# Patient Record
Sex: Male | Born: 1950 | Race: White | Hispanic: No | Smoking: Current every day smoker
Health system: Southern US, Community
[De-identification: ages and names within clinical notes are randomized; demographics above are authoritative.]

## PROBLEM LIST (undated history)

## (undated) DIAGNOSIS — E119 Type 2 diabetes mellitus without complications: Secondary | ICD-10-CM

## (undated) DIAGNOSIS — E1140 Type 2 diabetes mellitus with diabetic neuropathy, unspecified: Secondary | ICD-10-CM

## (undated) DIAGNOSIS — E7800 Pure hypercholesterolemia, unspecified: Secondary | ICD-10-CM

## (undated) DIAGNOSIS — I10 Essential (primary) hypertension: Secondary | ICD-10-CM

## (undated) DIAGNOSIS — H409 Unspecified glaucoma: Secondary | ICD-10-CM

## (undated) DIAGNOSIS — E11319 Type 2 diabetes mellitus with unspecified diabetic retinopathy without macular edema: Secondary | ICD-10-CM

## (undated) HISTORY — DX: Type 2 diabetes mellitus with diabetic neuropathy, unspecified: E11.40

## (undated) HISTORY — DX: Pure hypercholesterolemia, unspecified: E78.00

## (undated) HISTORY — DX: Unspecified glaucoma: H40.9

## (undated) HISTORY — DX: Essential (primary) hypertension: I10

## (undated) HISTORY — DX: Type 2 diabetes mellitus with unspecified diabetic retinopathy without macular edema: E11.319

---

## 1994-06-12 HISTORY — PX: WRIST SURGERY: SHX841

## 2012-12-19 ENCOUNTER — Encounter (HOSPITAL_COMMUNITY): Payer: Self-pay | Admitting: *Deleted

## 2012-12-19 ENCOUNTER — Observation Stay (HOSPITAL_COMMUNITY): Payer: 59

## 2012-12-19 ENCOUNTER — Emergency Department (HOSPITAL_COMMUNITY): Payer: 59

## 2012-12-19 ENCOUNTER — Observation Stay (HOSPITAL_COMMUNITY)
Admission: EM | Admit: 2012-12-19 | Discharge: 2012-12-20 | Disposition: A | Discharge status: Home / Self Care | Payer: 59 | Attending: Family Medicine | Admitting: Family Medicine

## 2012-12-19 DIAGNOSIS — Z6834 Body mass index (BMI) 34.0-34.9, adult: Secondary | ICD-10-CM | POA: Insufficient documentation

## 2012-12-19 DIAGNOSIS — R0789 Other chest pain: Secondary | ICD-10-CM | POA: Diagnosis present

## 2012-12-19 DIAGNOSIS — J189 Pneumonia, unspecified organism: Principal | ICD-10-CM | POA: Insufficient documentation

## 2012-12-19 DIAGNOSIS — R739 Hyperglycemia, unspecified: Secondary | ICD-10-CM

## 2012-12-19 DIAGNOSIS — T17320A Food in larynx causing asphyxiation, initial encounter: Secondary | ICD-10-CM

## 2012-12-19 DIAGNOSIS — E669 Obesity, unspecified: Secondary | ICD-10-CM | POA: Insufficient documentation

## 2012-12-19 DIAGNOSIS — R0600 Dyspnea, unspecified: Secondary | ICD-10-CM

## 2012-12-19 DIAGNOSIS — R071 Chest pain on breathing: Secondary | ICD-10-CM | POA: Insufficient documentation

## 2012-12-19 DIAGNOSIS — R0602 Shortness of breath: Secondary | ICD-10-CM | POA: Insufficient documentation

## 2012-12-19 DIAGNOSIS — E119 Type 2 diabetes mellitus without complications: Secondary | ICD-10-CM

## 2012-12-19 DIAGNOSIS — R030 Elevated blood-pressure reading, without diagnosis of hypertension: Secondary | ICD-10-CM | POA: Insufficient documentation

## 2012-12-19 LAB — CBC WITH DIFFERENTIAL/PLATELET
Basophils Absolute: 0 10*3/uL (ref 0.0–0.1)
Basophils Relative: 0 % (ref 0–1)
Eosinophils Absolute: 0 10*3/uL (ref 0.0–0.7)
Eosinophils Relative: 0 % (ref 0–5)
MCH: 31.2 pg (ref 26.0–34.0)
MCHC: 35.3 g/dL (ref 30.0–36.0)
Neutrophils Relative %: 77 % (ref 43–77)
Platelets: 187 10*3/uL (ref 150–400)
RBC: 5.03 MIL/uL (ref 4.22–5.81)
RDW: 12.4 % (ref 11.5–15.5)

## 2012-12-19 LAB — BASIC METABOLIC PANEL
Calcium: 9.4 mg/dL (ref 8.4–10.5)
Creatinine, Ser: 0.67 mg/dL (ref 0.50–1.35)
GFR calc non Af Amer: >90 mL/min (ref >90)
Glucose, Bld: 417 mg/dL — ABNORMAL HIGH (ref 70–99)
Sodium: 132 mEq/L — ABNORMAL LOW (ref 135–145)

## 2012-12-19 LAB — D-DIMER, QUANTITATIVE: D-Dimer, Quant: <0.27 ug/mL-FEU (ref 0.00–0.48)

## 2012-12-19 LAB — TROPONIN I: Troponin I: <0.3 ng/mL (ref <0.30)

## 2012-12-19 LAB — GLUCOSE, CAPILLARY

## 2012-12-19 MED ORDER — ACETAMINOPHEN 325 MG PO TABS: 650.0000 mg | ORAL_TABLET | ORAL | Status: DC | PRN

## 2012-12-19 MED ORDER — CLINDAMYCIN PHOSPHATE 600 MG/50ML IV SOLN
600.0000 mg | Freq: Once | INTRAVENOUS | Status: AC
  Administered 2012-12-19: 600 mg via INTRAVENOUS
  Filled 2012-12-19: qty 50

## 2012-12-19 MED ORDER — RISAQUAD PO CAPS: 2.0000 | ORAL_CAPSULE | Freq: Every day | ORAL | Status: DC

## 2012-12-19 MED ORDER — MORPHINE SULFATE 4 MG/ML IJ SOLN
4.0000 mg | INTRAMUSCULAR | Status: AC | PRN
  Administered 2012-12-19 (×2): 4 mg via INTRAVENOUS
  Filled 2012-12-19 (×2): qty 1

## 2012-12-19 MED ORDER — INSULIN ASPART 100 UNIT/ML ~~LOC~~ SOLN
0.0000 [IU] | Freq: Three times a day (TID) | SUBCUTANEOUS | Status: DC
  Administered 2012-12-20: 5 [IU] via SUBCUTANEOUS

## 2012-12-19 MED ORDER — BISACODYL 10 MG RE SUPP: 10.0000 mg | Freq: Every day | RECTAL | Status: DC | PRN

## 2012-12-19 MED ORDER — CLINDAMYCIN PHOSPHATE 600 MG/50ML IV SOLN
600.0000 mg | Freq: Three times a day (TID) | INTRAVENOUS | Status: DC
  Filled 2012-12-19 (×2): qty 50

## 2012-12-19 MED ORDER — DEXTROSE 5 % IV SOLN: 1.0000 g | INTRAVENOUS | Status: DC

## 2012-12-19 MED ORDER — ALBUTEROL SULFATE (5 MG/ML) 0.5% IN NEBU: 2.5000 mg | INHALATION_SOLUTION | RESPIRATORY_TRACT | Status: DC | PRN

## 2012-12-19 MED ORDER — MORPHINE SULFATE 4 MG/ML IJ SOLN: 4.0000 mg | INTRAMUSCULAR | Status: DC | PRN

## 2012-12-19 MED ORDER — SODIUM CHLORIDE 0.9 % IV SOLN
INTRAVENOUS | Status: DC
  Administered 2012-12-19 (×2): via INTRAVENOUS

## 2012-12-19 MED ORDER — DEXTROSE 5 % IV SOLN: 500.0000 mg | INTRAVENOUS | Status: DC

## 2012-12-19 MED ORDER — FLEET ENEMA 7-19 GM/118ML RE ENEM: 1.0000 | ENEMA | Freq: Every day | RECTAL | Status: DC | PRN

## 2012-12-19 MED ORDER — POTASSIUM CHLORIDE IN NACL 20-0.9 MEQ/L-% IV SOLN
INTRAVENOUS | Status: DC
  Administered 2012-12-19 – 2012-12-20 (×2): via INTRAVENOUS

## 2012-12-19 MED ORDER — ENOXAPARIN SODIUM 40 MG/0.4ML ~~LOC~~ SOLN
40.0000 mg | SUBCUTANEOUS | Status: DC
  Administered 2012-12-19: 40 mg via SUBCUTANEOUS
  Filled 2012-12-19: qty 0.4

## 2012-12-19 MED ORDER — POLYETHYLENE GLYCOL 3350 17 G PO PACK
17.0000 g | PACK | Freq: Every day | ORAL | Status: DC
  Filled 2012-12-19: qty 1

## 2012-12-19 MED ORDER — IOHEXOL 300 MG/ML  SOLN
80.0000 mL | Freq: Once | INTRAMUSCULAR | Status: AC | PRN
  Administered 2012-12-19: 80 mL via INTRAVENOUS

## 2012-12-19 MED ORDER — DEXTROSE 5 % IV SOLN
1.0000 g | Freq: Once | INTRAVENOUS | Status: AC
  Administered 2012-12-19: 1 g via INTRAVENOUS
  Filled 2012-12-19: qty 10

## 2012-12-19 MED ORDER — ONDANSETRON HCL 4 MG/2ML IJ SOLN: 4.0000 mg | Freq: Three times a day (TID) | INTRAMUSCULAR | Status: DC | PRN

## 2012-12-19 MED ORDER — INSULIN ASPART 100 UNIT/ML ~~LOC~~ SOLN: 0.0000 [IU] | Freq: Every day | SUBCUTANEOUS | Status: DC

## 2012-12-19 MED ORDER — IBUPROFEN 600 MG PO TABS
600.0000 mg | ORAL_TABLET | Freq: Four times a day (QID) | ORAL | Status: DC | PRN
  Administered 2012-12-19 – 2012-12-20 (×2): 600 mg via ORAL
  Filled 2012-12-19 (×2): qty 1

## 2012-12-19 MED ORDER — ONDANSETRON HCL 4 MG/2ML IJ SOLN
4.0000 mg | INTRAMUSCULAR | Status: DC | PRN
  Administered 2012-12-19: 4 mg via INTRAVENOUS
  Filled 2012-12-19: qty 2

## 2012-12-19 MED ORDER — ONDANSETRON HCL 4 MG/2ML IJ SOLN: 4.0000 mg | INTRAMUSCULAR | Status: DC | PRN

## 2012-12-19 MED ORDER — AZITHROMYCIN 250 MG PO TABS
500.0000 mg | ORAL_TABLET | Freq: Once | ORAL | Status: AC
  Administered 2012-12-19: 500 mg via ORAL
  Filled 2012-12-19: qty 2

## 2012-12-19 MED ORDER — CLINDAMYCIN PHOSPHATE 600 MG/50ML IV SOLN
INTRAVENOUS | Status: AC
  Filled 2012-12-19: qty 50

## 2012-12-19 NOTE — ED Notes (Signed)
Pulse ox 90% on RA, pt placed on O2 at 2L/min.  O2 sats now 95%.

## 2012-12-19 NOTE — ED Notes (Signed)
C/o right sided CP and SOB

## 2012-12-19 NOTE — ED Notes (Signed)
Reports choked on hot dog x 3 days ago - c/o increased sob and cp since.  C/o right sided cp, worse with movement.  Denies n/v/dizziness.

## 2012-12-19 NOTE — H&P (Signed)
[facility] History and Physical  Jeffrey Sosa  EXB:[id number]  DOB: 18-May-1951   DOA: 12/19/2012   PCP:   No PCP Per Patient   Chief Complaint:  Chest pain with movement and breathing since today  HPI: Jeffrey Sosa is a 62 y.o. male.  Obese Caucasian gentleman who has had no medical followup for the past 25 year, and who is very active in the construction business, a reports that he developed a pain in her right anterior chest or excessive extending his right arm at work yesterday. Since then he is having pain with movement of the arm and pain with breathing of note is that it is much better after he rests especially after overnight rest.   He reports that the pain got suddenly worse today he became very worried his family say he got very panicky and short of breath and came to the emergency room to evaluated. In the emergency room chest x-ray was done which revealed an abnormality, his d-dimer was negative, and the hospitalist service was called for admission for possible pneumonia. He also gave a history of having choked on food 3 days ago and emergency room physician felt he may have aspirated.  He denies fever or cough. And otherwise doesn't feel sick. For the past couple of years she's been waking up at night pass urine but he feels this is pretty normal.  He regards himself as being in very good health, and is proud of the fact that he comes from a very healthy family; he considers his weight to be normal.   Rewiew of Systems:   All systems negative except as marked bold or noted in the HPI;  Constitutional:    malaise, fever and chills. ;  Eyes:   eye pain, redness and discharge. ;  ENMT:   ear pain, hoarseness, nasal congestion, sinus pressure and sore throat. ;  Cardiovascular:    chest pain with breathing, palpitations, diaphoresis, dyspnea and peripheral edema.  Respiratory:   cough, hemoptysis, wheezing and stridor. ;  Gastrointestinal:  nausea, vomiting, diarrhea,  constipation, abdominal pain, melena, blood in stool, hematemesis, jaundice and rectal bleeding. unusual weight loss..   Genitourinary:    frequency, dysuria, incontinence,flank pain and hematuria; once/twice/night Musculoskeletal:   back pain and neck pain.  swelling and trauma.;  Skin: .  pruritus, rash, abrasions, bruising and skin lesion.; ulcerations Neuro:    headache, lightheadedness and neck stiffness.  weakness, altered level of consciousness, altered mental status, extremity weakness, burning feet, involuntary movement, seizure and syncope.  Psych:    anxiety, depression, insomnia, tearfulness, panic attacks, hallucinations, paranoia, suicidal or homicidal ideation   History reviewed. No pertinent past medical history.  History reviewed. No pertinent past surgical history.  Medications:  HOME MEDS: Prior to Admission medications   Not on File     Allergies:  No Known Allergies  Social History:   reports that he has been smoking Cigars.  He does not have any smokeless tobacco history on file. He reports that  drinks alcohol. He reports that he does not use illicit drugs.  Family History: See relevant sections of epic  Physical Exam: Filed Vitals:   12/19/12 1900 12/19/12 1930 12/19/12 1933 12/19/12 2009  BP: 150/68   154/89  Pulse:    102  Temp:    98 F (36.7 C)  TempSrc:    Oral  Resp: 21 24 18 18   Weight:    99.383 kg (219 lb 1.6 oz)  SpO2:  94%   Blood pressure 154/89, pulse 102, temperature 98 F (36.7 C), temperature source Oral, resp. rate 18, weight 99.383 kg (219 lb 1.6 oz), SpO2 94.00%. There is no height on file to calculate BMI.   GEN:  Pleasant obese and somewhat anxious middle-aged Caucasian gentleman lying bed ; cooperative with exam; eating 2 large hamburgers and an apple pie during the interview; no vegetables in his meal. PSYCH:  alert and oriented x4;   affect is appropriate. HEENT: Mucous membranes pink and anicteric; PERRLA; EOM intact;  no cervical lymphadenopathy nor thyromegaly or carotid bruit; no JVD; Breasts:: Not examined CHEST WALL: Tender right pectoralis CHEST: Normal respiration, coarse crackles past right lower half of his chest posteriorly HEART: Tachycardic regular rhythm; no murmurs rubs or gallops BACK: No kyphosis no scoliosis; no CVA tenderness ABDOMEN: Obese, soft non-tender; no masses, no organomegaly, normal abdominal bowel sounds;  Rectal Exam: Not done EXTREMITIES: No bone or joint deformity;; no edema; no ulcerations. Genitalia: not examined PULSES: 2+ and symmetric SKIN: Normal hydration no rash or ulceration CNS: Cranial nerves 2-12 grossly intact no focal lateralizing neurologic deficit   Labs on Admission:  Basic Metabolic Panel:  Recent Labs Lab 12/19/12 1735  NA 132*  K 4.1  CL 96  CO2 26  GLUCOSE 417*  BUN 15  CREATININE 0.67  CALCIUM 9.4   Liver Function Tests: No results found for this basename: AST, ALT, ALKPHOS, BILITOT, PROT, ALBUMIN,  in the last 168 hours No results found for this basename: LIPASE, AMYLASE,  in the last 168 hours No results found for this basename: AMMONIA,  in the last 168 hours CBC:  Recent Labs Lab 12/19/12 1735  WBC 11.9*  NEUTROABS 9.2*  HGB 15.7  HCT 44.5  MCV 88.5  PLT 187   Cardiac Enzymes:  Recent Labs Lab 12/19/12 1735  TROPONINI <0.30   BNP: No components found with this basename: POCBNP,  D-dimer: No components found with this basename: D-DIMER,  CBG: No results found for this basename: GLUCAP,  in the last 168 hours  Radiological Exams on Admission: Dg Chest Portable 1 View  12/19/2012   *RADIOLOGY REPORT*  Clinical Data: Chest pain and shortness of breath.  PORTABLE CHEST - 1 VIEW  Comparison: None.  Findings: Low volume lungs with indistinct right hemidiaphragm. Negative for edema or pneumothorax.  Likely no cardiomegaly when accounting for technique. No acute osseous abnormality.  IMPRESSION: Low volume lungs which  decreases sensitivity.  A right base opacity could either represent atelectasis or infiltrate.   Original Report Authenticated By: Tiburcio Pea    EKG: Independently reviewed. Sinus tachycardia otherwise normal   Assessment/Plan   Active Problems:   Dyspnea, transient,likely secondary to chest wall pain and anxiety; reassure   Chest wall pain  Likely a muscle strain as a result of his occupation and lack of exercise; will treat with NSAIDs   CAP (community acquired pneumonia)  Surprisingly abnormal chest exam; unlikely to be true pneumonia given the lack of supporting evidence; we'll treat initially as pneumonia but will get a CT scan of his chest since he does have a significant tobacco history.   Diabetes mellitus, type 2, newly discovered  Briefly discussed implications; check hemoglobin A1c; start sliding scale; will need diabetic education and likely discharge home insulin and endocrinology followup.    Obesity, unspecified  He agreed to discuss his weight; Briefly discussed the concept of having 50% of his meal as fruits and vegetables; discussed start to an exercise program  5-10 minutes 3 times per week and increase as tolerated. Advised that he would need a consult with a dietitian because of his diabetes; advised that we would wait for his blood sugars may normalize. Check thyroid function and fasting lipid  Elevated blood pressure  Advise Will continue to monitor while in and he would be wise to continue to monitor at home    Other plans as per orders.  Code Status: Full code Family Communication:  Plans discuss with patient and family at bedside Disposition Plan: Likely home in a day or 2   Isidora Laham Nocturnist [facility] Pager [phone]   12/19/2012, 8:40 PM

## 2012-12-19 NOTE — ED Provider Notes (Signed)
History    CSN: [id number] Arrival date & time 12/19/12  1636  First MD Initiated Contact with Patient 12/19/12 1708     Chief Complaint  Patient presents with  . Shortness of Breath    HPI Pt was seen at 1605.   Per pt, c/o gradual onset and worsening of persistent SOB and right sided chest "pain" since this morning. Pt states he works in [employer] and was walking up a ladder when he "grabbed an upright that was loose" with his right hand, and "jerked my body."  States he has had progressive right sided chest wall "spasm" pain and SOB since then. These symptoms worsen with deep breath and movement of his right arm. Denies direct injury, no palpitations, no abd pain, no N/V/D, no fevers. Pt also recalls he "choked" on food 3 days ago but has been able to eat without difficulty since that time.  Denies LOC, no AMS, no wheezing, sore throat.     History reviewed. No pertinent past medical history.  History reviewed. No pertinent past surgical history.   History  Substance Use Topics  . Smoking status: Current Every Day Smoker    Types: Cigars  . Smokeless tobacco: Not on file  . Alcohol Use: Yes     Comment: daily    Review of Systems ROS: Statement: All systems negative except as marked or noted in the HPI; Constitutional: Negative for fever and chills. ; ; Eyes: Negative for eye pain, redness and discharge. ; ; ENMT: Negative for ear pain, hoarseness, nasal congestion, sinus pressure and sore throat. ; ; Cardiovascular: +CP, SOB. Negative for palpitations, diaphoresis, and peripheral edema. ; ; Respiratory: Negative for cough, wheezing and stridor. ; ; Gastrointestinal: Negative for nausea, vomiting, diarrhea, abdominal pain, blood in stool, hematemesis, jaundice and rectal bleeding. . ; ; Genitourinary: Negative for dysuria, flank pain and hematuria. ; ; Musculoskeletal: Negative for back pain and neck pain. Negative for swelling and trauma.; ; Skin: Negative for pruritus, rash,  abrasions, blisters, bruising and skin lesion.; ; Neuro: Negative for headache, lightheadedness and neck stiffness. Negative for weakness, altered level of consciousness , altered mental status, extremity weakness, paresthesias, involuntary movement, seizure and syncope.       Allergies  Review of patient's allergies indicates no known allergies.  Home Medications  No current outpatient prescriptions on file. BP 182/98  Pulse 110  Temp(Src) 98.4 F (36.9 C) (Oral)  Resp 20  SpO2 93% Physical Exam 1715: Physical examination:  Nursing notes reviewed; Vital signs and O2 SAT reviewed;  Constitutional: Well developed, Well nourished, Well hydrated, Uncomfortable appearing;; Head:  Normocephalic, atraumatic; Eyes: EOMI, PERRL, No scleral icterus; ENMT: Mouth and pharynx normal, Mucous membranes moist; Neck: Supple, Full range of motion, No lymphadenopathy; Cardiovascular: Tachycardic rate and rhythm, No gallop; Respiratory: Breath sounds coarse & equal bilaterally, No wheezes.  Speaking full sentences, Normal respiratory effort/excursion; Chest: +right chest wall tender to palp. No soft tissue crepitus. No deformity. Movement normal; Abdomen: Soft, Nontender, Nondistended, Normal bowel sounds; Genitourinary: No CVA tenderness; Extremities: Pulses normal, No tenderness, No edema, No calf edema or asymmetry.; Neuro: AA&Ox3, Major CN grossly intact.  Speech clear. No gross focal motor or sensory deficits in extremities.; Skin: Color normal, Warm, Dry.   ED Course  Procedures     MDM  MDM Reviewed: previous chart, nursing note and vitals Interpretation: labs, ECG and x-ray    Date: 12/19/2012  Rate: 109  Rhythm: sinus tachycardia  QRS Axis: normal  Intervals:  normal  ST/T Wave abnormalities: normal  Conduction Disutrbances:none  Narrative Interpretation:   Old EKG Reviewed: none available  Results for orders placed during the hospital encounter of 12/19/12  BASIC METABOLIC PANEL       Result Value Range   Sodium 132 (*) 135 - 145 mEq/L   Potassium 4.1  3.5 - 5.1 mEq/L   Chloride 96  96 - 112 mEq/L   CO2 26  19 - 32 mEq/L   Glucose, Bld 417 (*) 70 - 99 mg/dL   BUN 15  6 - 23 mg/dL   Creatinine, Ser 1.61  0.50 - 1.35 mg/dL   Calcium 9.4  8.4 - 09.6 mg/dL   GFR calc non Af Amer >90  >90 mL/min   GFR calc Af Amer >90  >90 mL/min  CBC WITH DIFFERENTIAL      Result Value Range   WBC 11.9 (*) 4.0 - 10.5 K/uL   RBC 5.03  4.22 - 5.81 MIL/uL   Hemoglobin 15.7  13.0 - 17.0 g/dL   HCT 04.5  40.9 - 81.1 %   MCV 88.5  78.0 - 100.0 fL   MCH 31.2  26.0 - 34.0 pg   MCHC 35.3  30.0 - 36.0 g/dL   RDW 91.4  78.2 - 95.6 %   Platelets 187  150 - 400 K/uL   Neutrophils Relative % 77  43 - 77 %   Neutro Abs 9.2 (*) 1.7 - 7.7 K/uL   Lymphocytes Relative 12  12 - 46 %   Lymphs Abs 1.4  0.7 - 4.0 K/uL   Monocytes Relative 11  3 - 12 %   Monocytes Absolute 1.3 (*) 0.1 - 1.0 K/uL   Eosinophils Relative 0  0 - 5 %   Eosinophils Absolute 0.0  0.0 - 0.7 K/uL   Basophils Relative 0  0 - 1 %   Basophils Absolute 0.0  0.0 - 0.1 K/uL  TROPONIN I      Result Value Range   Troponin I <0.30  <0.30 ng/mL  D-DIMER, QUANTITATIVE      Result Value Range   D-Dimer, Quant <0.27  0.00 - 0.48 ug/mL-FEU   Dg Chest Portable 1 View 12/19/2012   *RADIOLOGY REPORT*  Clinical Data: Chest pain and shortness of breath.  PORTABLE CHEST - 1 VIEW  Comparison: None.  Findings: Low volume lungs with indistinct right hemidiaphragm. Negative for edema or pneumothorax.  Likely no cardiomegaly when accounting for technique. No acute osseous abnormality.  IMPRESSION: Low volume lungs which decreases sensitivity.  A right base opacity could either represent atelectasis or infiltrate.   Original Report Authenticated By: Tiburcio Pea    1840: CAP vs aspiration on CXR, given his of "choking" on food 3 days ago. Will tx for both. O2 Sats on arrival 89-90% R/A, increase to 93-94% with O2 N/C and pain meds. Doubt ACS with  normal troponin and EKG after 8+ hours of constant symptoms. Dx and testing d/w pt and family.  Questions answered.  Verb understanding, agreeable to admit.  T/C to [facility]. Irene Limbo, case discussed, including:  HPI, pertinent PM/SHx, VS/PE, dx testing, ED course and treatment:  Agreeable to observation admit, requests to write temporary orders, obtain medical bed to team 1.         Laray Anger, DO 12/20/12 1512

## 2012-12-20 DIAGNOSIS — R0609 Other forms of dyspnea: Secondary | ICD-10-CM

## 2012-12-20 LAB — CBC
HCT: 42.5 % (ref 39.0–52.0)
Hemoglobin: 14.6 g/dL (ref 13.0–17.0)
MCV: 90.4 fL (ref 78.0–100.0)
RDW: 12.3 % (ref 11.5–15.5)
WBC: 10.3 10*3/uL (ref 4.0–10.5)

## 2012-12-20 LAB — HIV ANTIBODY (ROUTINE TESTING W REFLEX): HIV: NONREACTIVE

## 2012-12-20 LAB — COMPREHENSIVE METABOLIC PANEL
Albumin: 3 g/dL — ABNORMAL LOW (ref 3.5–5.2)
BUN: 17 mg/dL (ref 6–23)
CO2: 29 mEq/L (ref 19–32)
Chloride: 102 mEq/L (ref 96–112)
Creatinine, Ser: 0.69 mg/dL (ref 0.50–1.35)
GFR calc Af Amer: >90 mL/min (ref >90)
GFR calc non Af Amer: >90 mL/min (ref >90)
Glucose, Bld: 330 mg/dL — ABNORMAL HIGH (ref 70–99)
Total Bilirubin: 1 mg/dL (ref 0.3–1.2)

## 2012-12-20 LAB — TROPONIN I: Troponin I: <0.3 ng/mL (ref <0.30)

## 2012-12-20 LAB — GLUCOSE, CAPILLARY: Glucose-Capillary: 269 mg/dL — ABNORMAL HIGH (ref 70–99)

## 2012-12-20 LAB — TSH: TSH: 1.421 u[IU]/mL (ref 0.350–4.500)

## 2012-12-20 LAB — LIPID PANEL: Cholesterol: 140 mg/dL (ref 0–200)

## 2012-12-20 MED ORDER — LIVING WELL WITH DIABETES BOOK
Freq: Once | Status: DC
  Filled 2012-12-20: qty 1

## 2012-12-20 MED ORDER — IBUPROFEN 600 MG PO TABS: 600.0000 mg | ORAL_TABLET | Freq: Four times a day (QID) | ORAL | Status: DC | PRN

## 2012-12-20 NOTE — Progress Notes (Signed)
Utilization Review Complete  

## 2012-12-20 NOTE — Plan of Care (Signed)
Problem: Discharge Progression Outcomes Goal: CBGs less than or equal to 180 on planned home regimen Outcome: Not Met (add Reason) Pt refuses to stay admitted to work on medication therapy.

## 2012-12-20 NOTE — Progress Notes (Signed)
[facility] PROGRESS NOTE  Jeffrey Sosa ZOX:[id number] DOB: April 16, 1951 DOA: 12/19/2012 PCP: No PCP Per Patient  Assessment/Plan:  Dyspnea, transient,likely secondary to chest wall pain and anxiety; resolved this am. Oxygen saturation level 92-94% on room air.    Chest wall pain  Likely a muscle strain as a result of his occupation and lack of exercise; improved this am. Related to movement particularly of right arm. continue NSAIDs. EKG yields sinus tachycardia. Troponin neg x1.    CAP (community acquired pneumonia)  Surprisingly abnormal chest exam; CT scan of his chest yield bibasilar atelectasis with low volumes. White count within the limits of normal. Afebrile. Non-toxic appearing. Will discontinue antibiotics   Diabetes mellitus, type 2, newly discovered  hemoglobin A1c pending;glucose 411 on admission and 269 this am. Will need diabetic education and likely discharge home insulin. Pt currently without PCP but son is arranging for follow up with [facility] family medicine. Pt verbalizes no interest in taking "pills or shots". Discussed at length long term effects of uncontrolled hyperglycemia. Pt verbalized willingness to see PCP at [facility] to track glucose.    Obesity, unspecified   BMI 34.4. Pt stated he does not anticipate any change in food choices or weight loss currently. Thyroid function pendig. Fasting lipid panel within the limits of normal.   Elevated blood pressure  Trending down this am. Recommended OP follow up with PCP while tracking glucose. Discussed relationship between uncontrolled glucose and BP   Code Status: full Family Communication: son at bedside Disposition Plan: home hopefully today   Consultants:  none  Procedures:  none  Antibiotics:   azithromycin 12/19/12  Rocephin 12/19/12>>12/20/12 HPI/Subjective: Sitting in chair. Reports only "soreness" to right chest   Objective: Filed Vitals:   12/19/12 2145 12/19/12 2239 12/20/12 0500  12/20/12 0617  BP: 136/86   138/84  Pulse: 103   74  Temp: 98.2 F (36.8 C)   97.4 F (36.3 C)  TempSrc: Oral   Oral  Resp: 18   18  Height:      Weight:   98.3 kg (216 lb 11.4 oz) 99.111 kg (218 lb 8 oz)  SpO2: 92% 92%  94%   No intake or output data in the 24 hours ending 12/20/12 0924 Filed Weights   12/19/12 2009 12/20/12 0500 12/20/12 0617  Weight: 99.383 kg (219 lb 1.6 oz) 98.3 kg (216 lb 11.4 oz) 99.111 kg (218 lb 8 oz)    Exam:   General:  Obese NAD  Cardiovascular: RRR No MGR trace LE edema at ankles   Respiratory: normal effort BS somewhat distant with fine crackles bilateral bases L>R no wheeze  Abdomen: obese soft +BS non-tender to palpation  Musculoskeletal: no clubbing no cyanosis   Data Reviewed: Basic Metabolic Panel:  Recent Labs Lab 12/19/12 1735 12/20/12 0545  NA 132* 136  K 4.1 5.2*  CL 96 102  CO2 26 29  GLUCOSE 417* 330*  BUN 15 17  CREATININE 0.67 0.69  CALCIUM 9.4 8.8   Liver Function Tests:  Recent Labs Lab 12/20/12 0545  AST 16  ALT 21  ALKPHOS 56  BILITOT 1.0  PROT 6.5  ALBUMIN 3.0*   No results found for this basename: LIPASE, AMYLASE,  in the last 168 hours No results found for this basename: AMMONIA,  in the last 168 hours CBC:  Recent Labs Lab 12/19/12 1735 12/20/12 0545  WBC 11.9* 10.3  NEUTROABS 9.2*  --   HGB 15.7 14.6  HCT 44.5 42.5  MCV 88.5  90.4  PLT 187 175   Cardiac Enzymes:  Recent Labs Lab 12/19/12 1735  TROPONINI <0.30   BNP (last 3 results) No results found for this basename: PROBNP,  in the last 8760 hours CBG:  Recent Labs Lab 12/19/12 2213 12/20/12 0729  GLUCAP 411* 269*    No results found for this or any previous visit (from the past 240 hour(s)).   Studies: Ct Chest W Contrast  12/19/2012   *RADIOLOGY REPORT*  Clinical Data: Crackles right chest.  Shortness of breath, chest pain.  CT CHEST WITH CONTRAST  Technique:  Multidetector CT imaging of the chest was performed  following the standard protocol during bolus administration of intravenous contrast.  Contrast: 80mL OMNIPAQUE IOHEXOL 300 MG/ML  SOLN  Comparison: Plain films earlier today.  Findings: Heart is normal size.  Coronary artery calcifications diffusely.  Aorta is normal caliber.  Low lung volumes.  There is bibasilar atelectasis.  No pleural effusions.  Borderline sized right paratracheal lymph nodes.  No axillary or hilar adenopathy. Visualized thyroid and chest wall soft tissues unremarkable.  Imaging into the upper abdomen shows no acute findings.  Gallstones noted within the gallbladder.  No acute bony abnormality.  IMPRESSION: Low lung volumes, bibasilar atelectasis.  Coronary artery disease.  Borderline sized right paratracheal lymph nodes, presumably reactive.  Cholelithiasis.   Original Report Authenticated By: Charlett Nose, M.D.   Dg Chest Portable 1 View  12/19/2012   *RADIOLOGY REPORT*  Clinical Data: Chest pain and shortness of breath.  PORTABLE CHEST - 1 VIEW  Comparison: None.  Findings: Low volume lungs with indistinct right hemidiaphragm. Negative for edema or pneumothorax.  Likely no cardiomegaly when accounting for technique. No acute osseous abnormality.  IMPRESSION: Low volume lungs which decreases sensitivity.  A right base opacity could either represent atelectasis or infiltrate.   Original Report Authenticated By: Tiburcio Pea    Scheduled Meds: . enoxaparin (LOVENOX) injection  40 mg Subcutaneous Q24H  . insulin aspart  0-15 Units Subcutaneous TID WC  . insulin aspart  0-5 Units Subcutaneous QHS  . living well with diabetes book   Does not apply Once  . polyethylene glycol  17 g Oral Daily   Continuous Infusions:   Principal Problem:    Time spent: 30 minutes    [facility] [place] M  [facility] Pager [phone]. If 7PM-7AM, please contact night-coverage at [email or web address], password [facility] 12/20/2012, 9:24 AM  LOS: 1 day

## 2012-12-20 NOTE — Progress Notes (Signed)
INITIAL NUTRITION ASSESSMENT  DOCUMENTATION CODES Per approved criteria  -Obesity Class I   INTERVENTION: Provided DM diet education Outpatient DM class contact information provided  NUTRITION DIAGNOSIS: Nutrition knowledge deficit related to DM guidelines as evidenced by no prior need for information due to new DM diagnosis.  Goal: Pt will follw-up with outpatient diabetes education   Reason for Assessment: RD consulted for nutrition education regarding diabetes.   62 y.o. male  Admitting Dx: CAP (community acquired pneumonia)  ASSESSMENT: Pt reports usual diet as Regular. New diabetes diagnosis. 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 Usually a biscuit, eggs and bacon for breakfast a hamburger for lunch and then regularly skips evening meal because he's not hungry after being outside in the heat all day. He does like fresh and canned fruit in it's own juice and drinks primarily water.  RD provided "Carbohydrate Counting" and "Weight Loss Tips" handouts. Discussed different food groups and their effects on blood sugar, emphasizing carbohydrate-containing foods. Provided list of carbohydrates and recommended serving sizes of common foods.  Discussed importance of controlled and consistent carbohydrate intake throughout the day. Provided examples of ways to balance meals/snacks and encouraged intake of high-fiber, whole grain complex carbohydrates.   Expect fair compliance.  Height: Ht Readings from Last 1 Encounters:  12/19/12 5\' 7"  (1.702 m)    Weight: Wt Readings from Last 1 Encounters:  12/20/12 218 lb 8 oz (99.111 kg)    Ideal Body Weight: 148# (67.2kg)  % Ideal Body Weight: 147%  Wt Readings from Last 10 Encounters:  12/20/12 218 lb 8 oz (99.111 kg)    Usual Body Weight: 215-220#  % Usual Body Weight: 100%  BMI:  Body mass index is 34.21 kg/(m^2).Obesity Class I  Estimated Nutritional Needs: Kcal: 1700-1900 Protein: 87-99 gr Fluid:  >2000 ml/day  Skin: No issues noted  Diet Order: Carb Control  EDUCATION NEEDS: -Education needs addressed   Intake/Output Summary (Last 24 hours) at 12/20/12 1013 Last data filed at 12/20/12 0940  Gross per 24 hour  Intake    240 ml  Output      0 ml  Net    240 ml    Last BM: 12/19/12  Labs:   Recent Labs Lab 12/19/12 1735 12/20/12 0545  NA 132* 136  K 4.1 5.2*  CL 96 102  CO2 26 29  BUN 15 17  CREATININE 0.67 0.69  CALCIUM 9.4 8.8  GLUCOSE 417* 330*    CBG (last 3)   Recent Labs  12/19/12 2213 12/20/12 0729  GLUCAP 411* 269*    Scheduled Meds: . enoxaparin (LOVENOX) injection  40 mg Subcutaneous Q24H  . insulin aspart  0-15 Units Subcutaneous TID WC  . insulin aspart  0-5 Units Subcutaneous QHS  . living well with diabetes book   Does not apply Once  . polyethylene glycol  17 g Oral Daily    Continuous Infusions:   History reviewed. No pertinent past medical history.  History reviewed. No pertinent past surgical history.  Royann Shivers MS,RD,LDN,CSG Office: [phone] Pager: [phone]

## 2012-12-20 NOTE — Progress Notes (Signed)
Pt discharged with instructions and prescriptions.  Pt verbalized understanding but was not interested in learning about diabetes.  The Diabetic booklet was given but the patient would not stay admitted long enough to discuss the book.  He was very anxious to leave.  The patient left the floor via ambulation with staff in stable condition.

## 2012-12-20 NOTE — Discharge Summary (Signed)
Patient seen and examined. Agree with discharge. See my progress note same date.  Note principal discharge problem: Atypical chest pain. No evidence of pneumonia.  Patient not interested in treatment for diabetes.  Brendia Sacks, MD [facility] [phone]

## 2012-12-20 NOTE — Discharge Summary (Signed)
Physician Discharge Summary  Jeffrey Sosa JYN:[id number] DOB: 10-22-1950 DOA: 12/19/2012  PCP: No PCP Per Patient  Admit date: 12/19/2012 Discharge date: 12/20/2012  Time spent: 40 minutes  Recommendations for Outpatient Follow-up:  1. Follow up with [facility] medical practice 1 week to track serum glucose and BP 2. Recommend OP follow up with cardiology for evaluation of CAD, PCP can refer 3. Recommend carb modified diet  Discharge Diagnoses:  Principal Problem:   CAP (community acquired pneumonia) Active Problems:   Dyspnea   Chest wall pain   Diabetes mellitus, type 2   Obesity, unspecified   Discharge Condition: stable  Diet recommendation: carb modified  Filed Weights   12/19/12 2009 12/20/12 0500 12/20/12 0617  Weight: 99.383 kg (219 lb 1.6 oz) 98.3 kg (216 lb 11.4 oz) 99.111 kg (218 lb 8 oz)    History of present illness:  Jeffrey Sosa is a 62 y.o. male Obese  gentleman who has had no medical followup for the past 25 year, and who is very active in the construction business, a reported that he developed a pain in her right anterior chest after excessive extending his right arm at work on 12/18/12. Since that time he reported  having pain with movement of the arm and pain with breathing, that it is much better after he rests especially after overnight rest. He presented to [facility] on 12/19/12 with cc chest pain and shortness of breath. He reported that the pain got suddenly worse on day of presentation and  he became very worried. His family said he got very panicky and short of breath and came to the emergency room to evaluated. In the emergency room chest x-ray was done which revealed an abnormality, his d-dimer was negative, he was tachycardic and tachypneic without hypoxia. The hospitalist service was called for admission for possible pneumonia. He also gave a history of having choked on food 3 days prior and emergency room physician felt he may have aspirated.He denied fever or  cough. And otherwise did not feel sick. For the past couple of years he's been waking up at night to pass urine but he feels this is pretty normal. He regarded himself as being in very good health, and is proud of the fact that he comes from a very healthy family; he considered his weight to be normal.   Hospital Course:  Dyspnea, transient,likely secondary to chest wall pain and anxiety; resolved on morning of discharge.  Oxygen saturation level remained at  92-94% on room air.  Chest wall pain  Likely a muscle strain as a result of his occupation and lack of exercise; improved by day of discharge after NSAIDS. Related to movement particularly of right arm. continue NSAIDs. EKG yields sinus tachycardia. Troponin neg x1. Lipid panel within the limits of normal. Chest CT yields CAD. Recommend OP follow up with cardiology. Recommended NSAID prn for pain.  CAP (community acquired pneumonia)  Surprisingly abnormal chest exam; CT scan of his chest yield bibasilar atelectasis with low volumes. White count within the limits of normal. Afebrile. Non-toxic appearing. Received 2 doses azithromycin, rocephin. Antibiotics discontinued at discharge.   Diabetes mellitus, type 2, newly discovered  hemoglobin A1c pending;glucose 411 on admission and 269 this am. Carb modified diet discussed with patient. He refuses pharmacologic intervention at this time. Pt currently without PCP but son is arranging for follow up with [facility] family medicine. Pt verbalizes no interest in taking "pills or shots". Discussed at length long term effects of uncontrolled hyperglycemia. Pt  verbalized willingness to see PCP at [facility] to track glucose.  Obesity, unspecified  BMI 34.4. Pt stated he does not anticipate any change in food choices or weight loss currently. Thyroid function pendig. Fasting lipid panel within the limits of normal.  Elevated blood pressure  Trending down at discharge.  Recommended OP follow up with PCP while  tracking glucose. Discussed relationship between uncontrolled glucose and BP      Procedures:  Consultations:  none  Discharge Exam: Filed Vitals:   12/19/12 2145 12/19/12 2239 12/20/12 0500 12/20/12 0617  BP: 136/86   138/84  Pulse: 103   74  Temp: 98.2 F (36.8 C)   97.4 F (36.3 C)  TempSrc: Oral   Oral  Resp: 18   18  Height:      Weight:   98.3 kg (216 lb 11.4 oz) 99.111 kg (218 lb 8 oz)  SpO2: 92% 92%  94%    General: obese NAD Cardiovascular: RRR No MGR trace LE edema Respiratory: normal effort BS slightly distant with fine crackles bilateral bases no wheeze no rhonchi Abdomen : obese soft +BS non-tender to palpation  Discharge Instructions  Discharge Orders   Future Orders Complete By Expires     Call MD for:  difficulty breathing, headache or visual disturbances  As directed     Call MD for:  persistant dizziness or light-headedness  As directed     Diet Carb Modified  As directed     Discharge instructions  As directed     Comments:      Take medication as directed. Recommend OP follow up to trend serum glucose Recommend OP follow up with cardiology to evaluate CAD, can be referred by PCP    Increase activity slowly  As directed         Medication List         ibuprofen 600 MG tablet  Commonly known as:  ADVIL,MOTRIN  Take 1 tablet (600 mg total) by mouth every 6 (six) hours as needed.       No Known Allergies     Follow-up Information   Follow up with [facility]. Schedule an appointment as soon as possible for a visit in 1 week. (follow up on serum glucose, BP and may need cardiology referral for evaluation of CAD)    Contact information:   [address] [place] [facility] [address] [phone]       The results of significant diagnostics from this hospitalization (including imaging, microbiology, ancillary and laboratory) are listed below for reference.    Significant Diagnostic Studies: Ct Chest W  Contrast  12/19/2012   *RADIOLOGY REPORT*  Clinical Data: Crackles right chest.  Shortness of breath, chest pain.  CT CHEST WITH CONTRAST  Technique:  Multidetector CT imaging of the chest was performed following the standard protocol during bolus administration of intravenous contrast.  Contrast: 80mL OMNIPAQUE IOHEXOL 300 MG/ML  SOLN  Comparison: Plain films earlier today.  Findings: Heart is normal size.  Coronary artery calcifications diffusely.  Aorta is normal caliber.  Low lung volumes.  There is bibasilar atelectasis.  No pleural effusions.  Borderline sized right paratracheal lymph nodes.  No axillary or hilar adenopathy. Visualized thyroid and chest wall soft tissues unremarkable.  Imaging into the upper abdomen shows no acute findings.  Gallstones noted within the gallbladder.  No acute bony abnormality.  IMPRESSION: Low lung volumes, bibasilar atelectasis.  Coronary artery disease.  Borderline sized right paratracheal lymph nodes, presumably reactive.  Cholelithiasis.   Original Report Authenticated By: Charlett Nose, M.D.   Dg Chest Portable 1 View  12/19/2012   *RADIOLOGY REPORT*  Clinical Data: Chest pain and shortness of breath.  PORTABLE CHEST - 1 VIEW  Comparison: None.  Findings: Low volume lungs with indistinct right hemidiaphragm. Negative for edema or pneumothorax.  Likely no cardiomegaly when accounting for technique. No acute osseous abnormality.  IMPRESSION: Low volume lungs which decreases sensitivity.  A right base opacity could either represent atelectasis or infiltrate.   Original Report Authenticated By: Tiburcio Pea    Microbiology: No results found for this or any previous visit (from the past 240 hour(s)).   Labs: Basic Metabolic Panel:  Recent Labs Lab 12/19/12 1735 12/20/12 0545  NA 132* 136  K 4.1 5.2*  CL 96 102  CO2 26 29  GLUCOSE 417* 330*  BUN 15 17  CREATININE 0.67 0.69  CALCIUM 9.4 8.8   Liver Function Tests:  Recent Labs Lab 12/20/12 0545  AST  16  ALT 21  ALKPHOS 56  BILITOT 1.0  PROT 6.5  ALBUMIN 3.0*   No results found for this basename: LIPASE, AMYLASE,  in the last 168 hours No results found for this basename: AMMONIA,  in the last 168 hours CBC:  Recent Labs Lab 12/19/12 1735 12/20/12 0545  WBC 11.9* 10.3  NEUTROABS 9.2*  --   HGB 15.7 14.6  HCT 44.5 42.5  MCV 88.5 90.4  PLT 187 175   Cardiac Enzymes:  Recent Labs Lab 12/19/12 1735 12/20/12 0545  TROPONINI <0.30 <0.30   BNP: BNP (last 3 results) No results found for this basename: PROBNP,  in the last 8760 hours CBG:  Recent Labs Lab 12/19/12 2213 12/20/12 0729  GLUCAP 411* 269*       Signed:  Toya Smothers M  [facility] 12/20/2012, 11:09 AM

## 2012-12-20 NOTE — Progress Notes (Signed)
Patient seen, independently examined and chart reviewed. I agree with exam, assessment and plan discussed with Jeffrey Smothers, NP. I agree with discharge.  Very pleasant 62 year old man who works in [employer]. Yesterday he was on a ladder and reached for something and developed pain in his right arm and shoulder. He iced it and took some aspirin but the pain worsened over time. He became very concerned and short of breath and so came to the emergency department. He was noted be mildly tachycardic there and tachypneic. No hypoxia. Chest x-ray suggested atelectasis or infiltrate. Chest CT revealed bibasilar atelectasis and coronary artery disease. Additionally he apparently choked on hot dog several days prior to admission.   He feels fine today. He has some right sided chest soreness when he moves his arm. He is breathing fine and has no other complaints. He appears calm and comfortable.  Cardiac enzymes were negative. Complete metabolic panel unremarkable with the exception of very mild hyperkalemia and notable hyperglycemia spontaneous resolution of hyperkalemia would be expected. CBC unremarkable.  In summary patient's history and symptomology is most suggestive of musculoskeletal strain. Although his chest CT did show coronary artery disease, his enzymes were negative and hand no typical symptoms to suggest angina. EKG on admission was unremarkable except for sinus tachycardia. New diagnosis of diabetes mellitus. The patient is aware of this and plans to followup with her primary care physician Jeffrey Sosa by his son and reports he will check his blood sugars. He refuses pharmacologic intervention at this point.  Additionally further evaluation for coronary artery disease can be conducted in the outpatient setting. Coronary artery disease was discussed with the patient.  Brendia Sacks, MD [facility] [phone]

## 2012-12-24 LAB — CULTURE, BLOOD (ROUTINE X 2)
Culture: NO GROWTH
Culture: NO GROWTH

## 2015-01-11 NOTE — Patient Instructions (Signed)
Your procedure is scheduled on: 01/19/2015  Report to [facility] at  615   AM.  Call this number if you have problems the morning of surgery: [phone]   Do not eat food or drink liquids :After Midnight.      Take these medicines the morning of surgery with A SIP OF WATER: none   Do not wear jewelry, make-up or nail polish.  Do not wear lotions, powders, or perfumes.   Do not shave 48 hours prior to surgery.  Do not bring valuables to the hospital.  Contacts, dentures or bridgework may not be worn into surgery.  Leave suitcase in the car. After surgery it may be brought to your room.  For patients admitted to the hospital, checkout time is 11:00 AM the day of discharge.   Patients discharged the day of surgery will not be allowed to drive home.  :     Please read over the following fact sheets that you were given: Coughing and Deep Breathing, Surgical Site Infection Prevention, Anesthesia Post-op Instructions and Care and Recovery After Surgery    Cataract A cataract is a clouding of the lens of the eye. When a lens becomes cloudy, vision is reduced based on the degree and nature of the clouding. Many cataracts reduce vision to some degree. Some cataracts make people more near-sighted as they develop. Other cataracts increase glare. Cataracts that are ignored and become worse can sometimes look white. The white color can be seen through the pupil. CAUSES   Aging. However, cataracts may occur at any age, even in newborns.   Certain drugs.   Trauma to the eye.   Certain diseases such as diabetes.   Specific eye diseases such as chronic inflammation inside the eye or a sudden attack of a rare form of glaucoma.   Inherited or acquired medical problems.  SYMPTOMS   Gradual, progressive drop in vision in the affected eye.   Severe, rapid visual loss. This most often happens when trauma is the cause.  DIAGNOSIS  To detect a cataract, an eye doctor examines the lens. Cataracts are  best diagnosed with an exam of the eyes with the pupils enlarged (dilated) by drops.  TREATMENT  For an early cataract, vision may improve by using different eyeglasses or stronger lighting. If that does not help your vision, surgery is the only effective treatment. A cataract needs to be surgically removed when vision loss interferes with your everyday activities, such as driving, reading, or watching TV. A cataract may also have to be removed if it prevents examination or treatment of another eye problem. Surgery removes the cloudy lens and usually replaces it with a substitute lens (intraocular lens, IOL).  At a time when both you and your doctor agree, the cataract will be surgically removed. If you have cataracts in both eyes, only one is usually removed at a time. This allows the operated eye to heal and be out of danger from any possible problems after surgery (such as infection or poor wound healing). In rare cases, a cataract may be doing damage to your eye. In these cases, your caregiver may advise surgical removal right away. The vast majority of people who have cataract surgery have better vision afterward. HOME CARE INSTRUCTIONS  If you are not planning surgery, you may be asked to do the following:  Use different eyeglasses.   Use stronger or brighter lighting.   Ask your eye doctor about reducing your medicine dose or changing medicines  if it is thought that a medicine caused your cataract. Changing medicines does not make the cataract go away on its own.   Become familiar with your surroundings. Poor vision can lead to injury. Avoid bumping into things on the affected side. You are at a higher risk for tripping or falling.   Exercise extreme care when driving or operating machinery.   Wear sunglasses if you are sensitive to bright light or experiencing problems with glare.  SEEK IMMEDIATE MEDICAL CARE IF:   You have a worsening or sudden vision loss.   You notice redness,  swelling, or increasing pain in the eye.   You have a fever.  Document Released: 05/29/2005 Document Revised: 05/18/2011 Document Reviewed: 01/20/2011 [facility] Patient Information 2012 [place].PATIENT INSTRUCTIONS POST-ANESTHESIA  IMMEDIATELY FOLLOWING SURGERY:  Do not drive or operate machinery for the first twenty four hours after surgery.  Do not make any important decisions for twenty four hours after surgery or while taking narcotic pain medications or sedatives.  If you develop intractable nausea and vomiting or a severe headache please notify your doctor immediately.  FOLLOW-UP:  Please make an appointment with your surgeon as instructed. You do not need to follow up with anesthesia unless specifically instructed to do so.  WOUND CARE INSTRUCTIONS (if applicable):  Keep a dry clean dressing on the anesthesia/puncture wound site if there is drainage.  Once the wound has quit draining you may leave it open to air.  Generally you should leave the bandage intact for twenty four hours unless there is drainage.  If the epidural site drains for more than 36-48 hours please call the anesthesia department.  QUESTIONS?:  Please feel free to call your physician or the hospital operator if you have any questions, and they will be happy to assist you.

## 2015-01-13 ENCOUNTER — Encounter (HOSPITAL_COMMUNITY)
Admission: RE | Admit: 2015-01-13 | Discharge: 2015-01-13 | Disposition: A | Discharge status: Home / Self Care | Payer: Medicare Other | Source: Ambulatory Visit | Attending: Ophthalmology | Admitting: Ophthalmology

## 2015-01-13 ENCOUNTER — Encounter (HOSPITAL_COMMUNITY): Payer: Self-pay

## 2015-01-13 ENCOUNTER — Other Ambulatory Visit: Payer: Self-pay

## 2015-01-13 DIAGNOSIS — Z01818 Encounter for other preprocedural examination: Secondary | ICD-10-CM | POA: Insufficient documentation

## 2015-01-13 DIAGNOSIS — H2511 Age-related nuclear cataract, right eye: Secondary | ICD-10-CM | POA: Insufficient documentation

## 2015-01-13 HISTORY — DX: Type 2 diabetes mellitus without complications: E11.9

## 2015-01-13 LAB — CBC
HEMATOCRIT: 44.3 % (ref 39.0–52.0)
HEMOGLOBIN: 14.7 g/dL (ref 13.0–17.0)
MCH: 30.3 pg (ref 26.0–34.0)
MCHC: 33.2 g/dL (ref 30.0–36.0)
MCV: 91.3 fL (ref 78.0–100.0)
PLATELETS: 214 10*3/uL (ref 150–400)
RBC: 4.85 MIL/uL (ref 4.22–5.81)
RDW: 13.4 % (ref 11.5–15.5)
WBC: 8.1 10*3/uL (ref 4.0–10.5)

## 2015-01-13 LAB — BASIC METABOLIC PANEL
ANION GAP: 9 (ref 5–15)
BUN: 19 mg/dL (ref 6–20)
CHLORIDE: 103 mmol/L (ref 101–111)
CO2: 28 mmol/L (ref 22–32)
Calcium: 9.5 mg/dL (ref 8.9–10.3)
Creatinine, Ser: 0.85 mg/dL (ref 0.61–1.24)
GFR calc non Af Amer: >60 mL/min (ref >60)
Glucose, Bld: 132 mg/dL — ABNORMAL HIGH (ref 65–99)
Potassium: 4.2 mmol/L (ref 3.5–5.1)
Sodium: 140 mmol/L (ref 135–145)

## 2015-01-14 ENCOUNTER — Encounter (HOSPITAL_COMMUNITY)
Admission: RE | Admit: 2015-01-14 | Discharge: 2015-01-14 | Disposition: A | Discharge status: Home / Self Care | Payer: Medicare Other | Source: Ambulatory Visit | Attending: Ophthalmology | Admitting: Ophthalmology

## 2015-01-18 MED ORDER — TETRACAINE HCL 0.5 % OP SOLN
OPHTHALMIC | Status: AC
  Filled 2015-01-18: qty 2

## 2015-01-18 MED ORDER — PHENYLEPHRINE HCL 2.5 % OP SOLN
OPHTHALMIC | Status: AC
Start: 2015-01-18 — End: 2015-01-18
  Filled 2015-01-18: qty 15

## 2015-01-18 MED ORDER — CYCLOPENTOLATE-PHENYLEPHRINE OP SOLN OPTIME - NO CHARGE
OPHTHALMIC | Status: AC
  Filled 2015-01-18: qty 2

## 2015-01-18 MED ORDER — KETOROLAC TROMETHAMINE 0.5 % OP SOLN
OPHTHALMIC | Status: AC
  Filled 2015-01-18: qty 5

## 2015-01-19 ENCOUNTER — Ambulatory Visit (HOSPITAL_COMMUNITY): Payer: Medicare Other | Admitting: Anesthesiology

## 2015-01-19 ENCOUNTER — Encounter (HOSPITAL_COMMUNITY)
Admission: RE | Disposition: A | Discharge status: Home / Self Care | Payer: Self-pay | Source: Ambulatory Visit | Attending: Ophthalmology

## 2015-01-19 ENCOUNTER — Encounter (HOSPITAL_COMMUNITY): Payer: Self-pay | Admitting: *Deleted

## 2015-01-19 ENCOUNTER — Ambulatory Visit (HOSPITAL_COMMUNITY)
Admission: RE | Admit: 2015-01-19 | Discharge: 2015-01-19 | Disposition: A | Discharge status: Home / Self Care | Payer: Medicare Other | Source: Ambulatory Visit | Attending: Ophthalmology | Admitting: Ophthalmology

## 2015-01-19 DIAGNOSIS — H2511 Age-related nuclear cataract, right eye: Secondary | ICD-10-CM | POA: Diagnosis not present

## 2015-01-19 DIAGNOSIS — R0602 Shortness of breath: Secondary | ICD-10-CM | POA: Diagnosis not present

## 2015-01-19 DIAGNOSIS — F17210 Nicotine dependence, cigarettes, uncomplicated: Secondary | ICD-10-CM | POA: Insufficient documentation

## 2015-01-19 DIAGNOSIS — E119 Type 2 diabetes mellitus without complications: Secondary | ICD-10-CM | POA: Insufficient documentation

## 2015-01-19 HISTORY — PX: CATARACT EXTRACTION W/PHACO: SHX586

## 2015-01-19 LAB — GLUCOSE, CAPILLARY
GLUCOSE-CAPILLARY: 124 mg/dL — AB (ref 65–99)
GLUCOSE-CAPILLARY: 117 mg/dL — AB (ref 65–99)

## 2015-01-19 IMAGING — CT CT CHEST W/ CM
2 of 3 series · 15 of 36 positions shown, 18 images · IV contrast (Omnipaque 300)
Comparison: Plain films earlier today.

CLINICAL DATA: Crackles right chest.  Shortness of breath, chest
pain.

CT CHEST WITH CONTRAST
TECHNIQUE: Multidetector CT imaging of the chest was performed
following the standard protocol during bolus administration of
intravenous contrast.
Contrast: 80mL OMNIPAQUE IOHEXOL 300 MG/ML  SOLN

[Series 2: chestroutine 5.0 b40f · axial · 0.79mm/px · z∈[-352,-67]mm · 12 of 69 slices shown, 15 images]
[im 6/69  mediastinal]
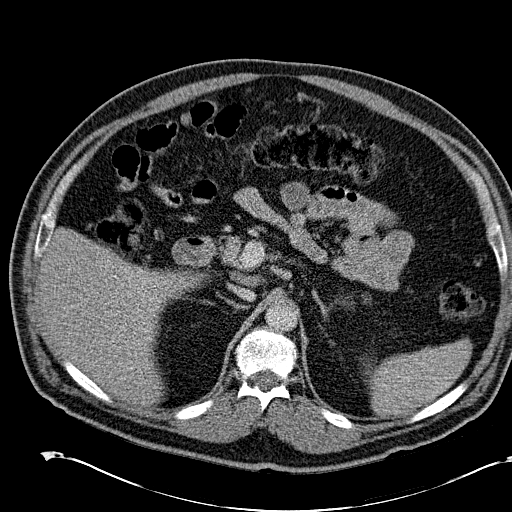
[im 6/69  lung]
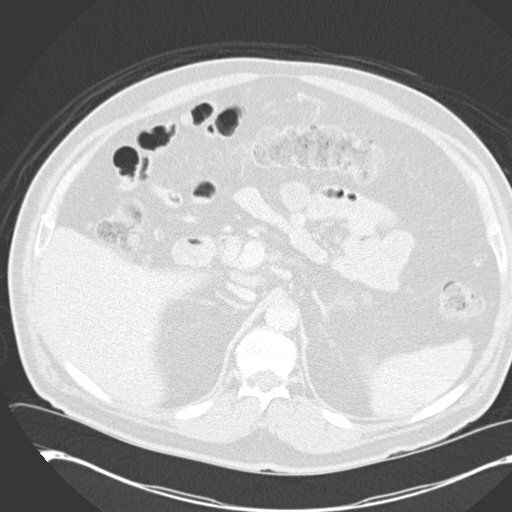
[im 11/69  lung]
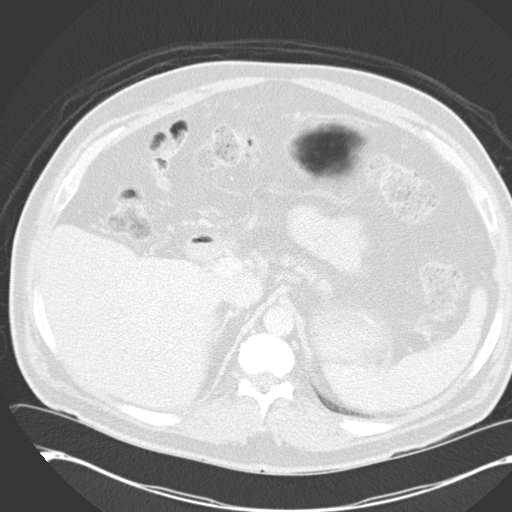
[im 16/69  lung]
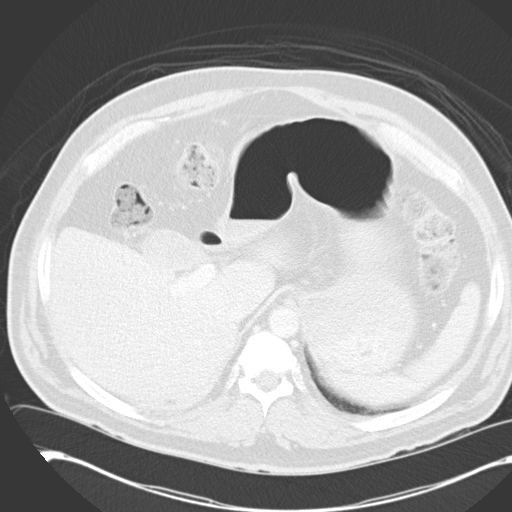
[im 21/69  lung]
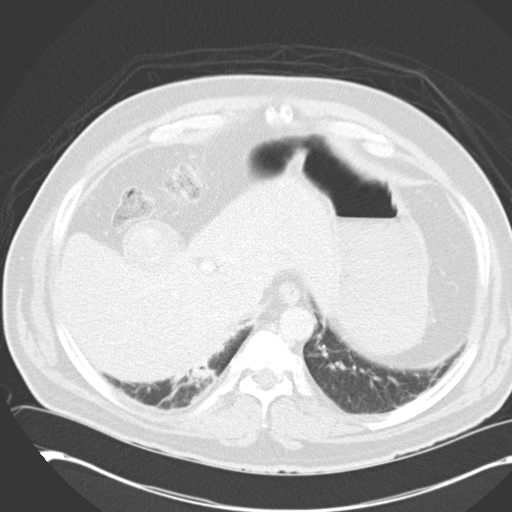
[im 26/69  mediastinal]
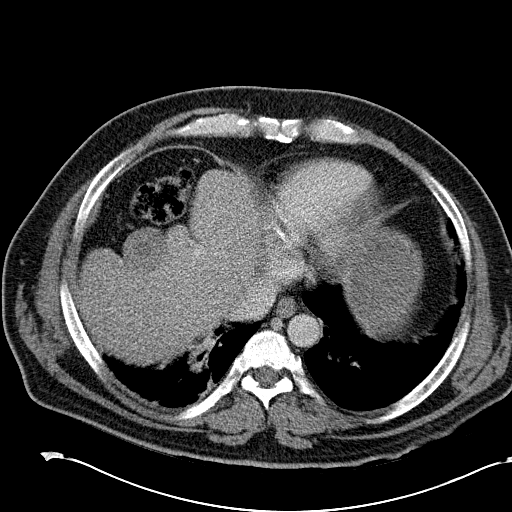
[im 26/69  lung]
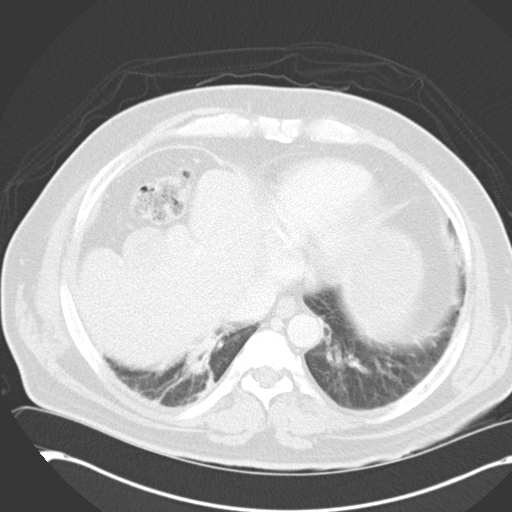
[im 31/69  lung]
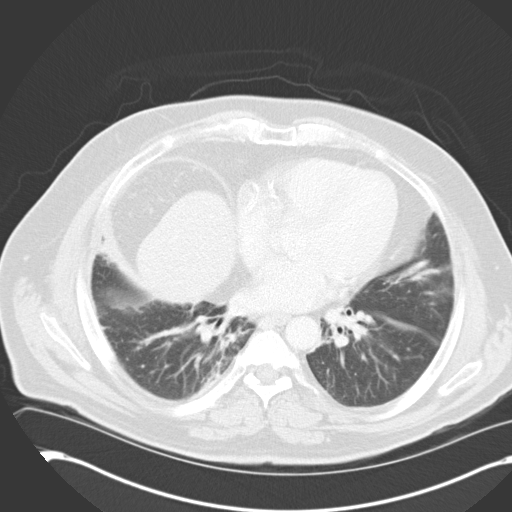
[im 38/69  lung]
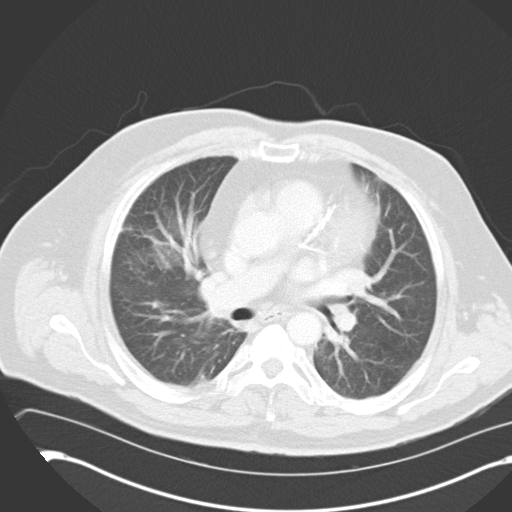
[im 43/69  lung]
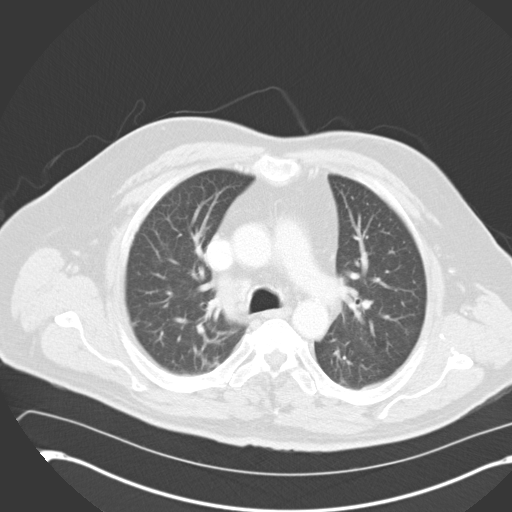
[im 48/69  mediastinal]
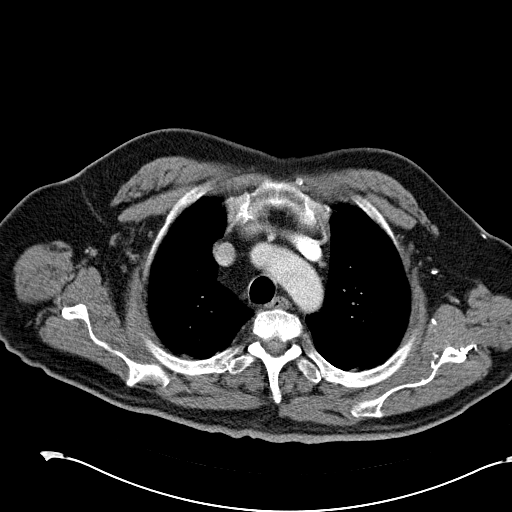
[im 48/69  lung]
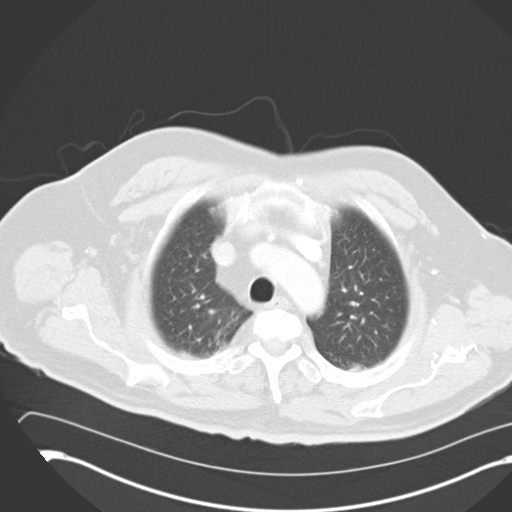
[im 53/69  lung]
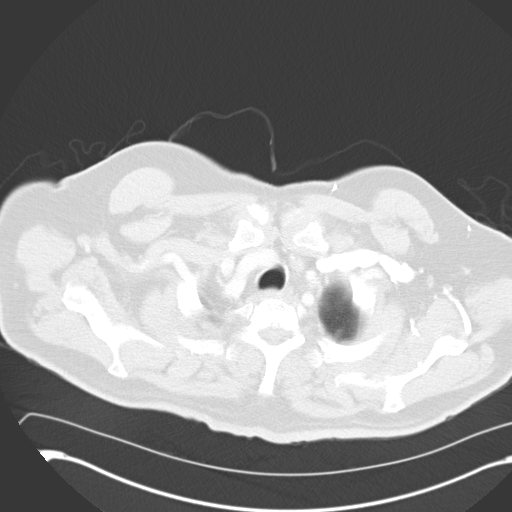
[im 58/69  lung]
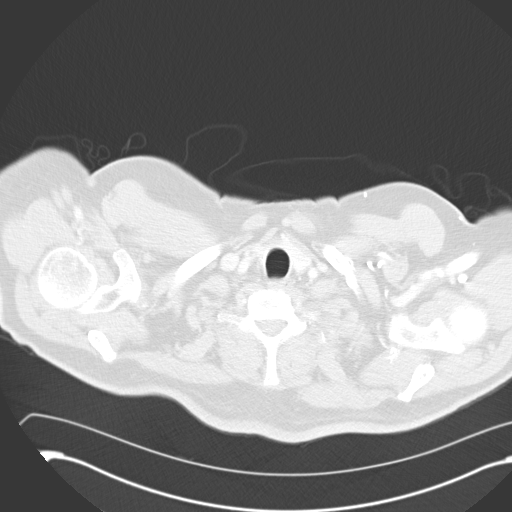
[im 63/69  lung]
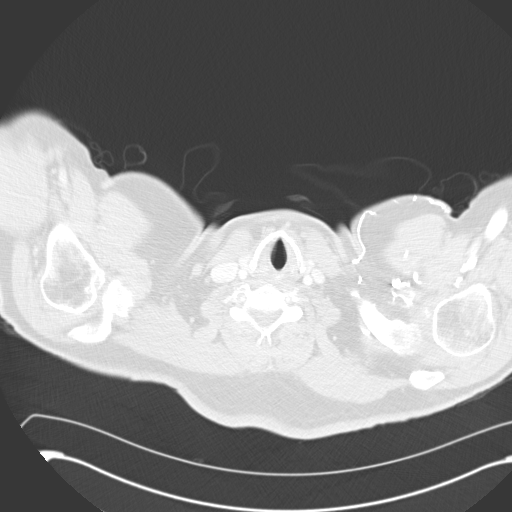

[Series 4: mpr coronal chest 3mm · coronal · 0.67mm/px · 3 of 110 slices shown]
[im 22/110  lung]
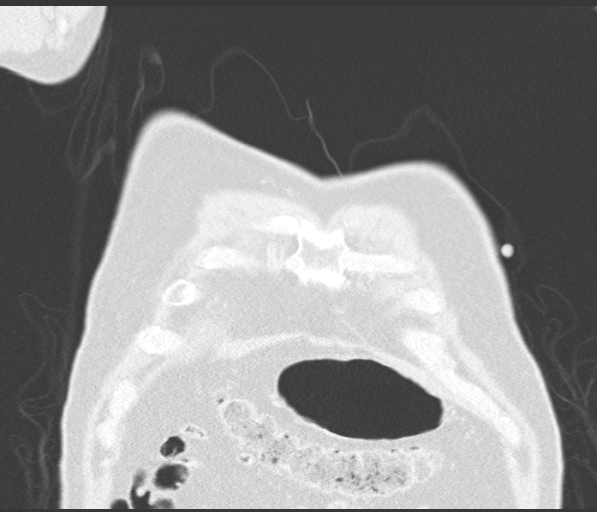
[im 44/110  lung]
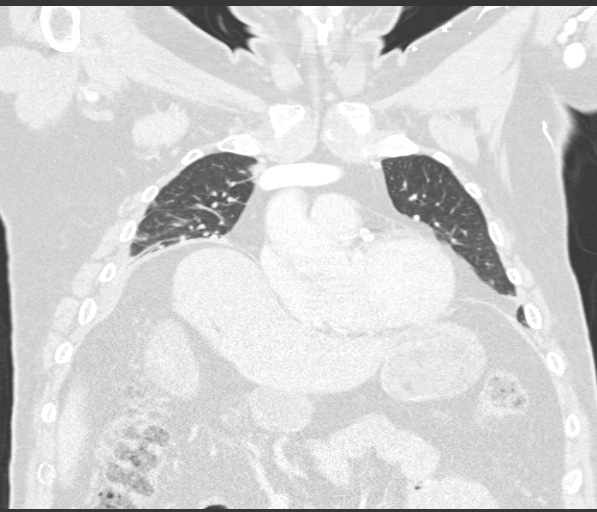
[im 66/110  lung]
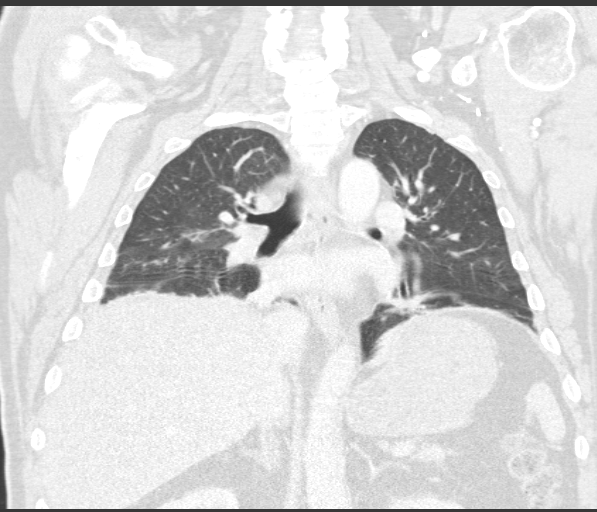

[15 of 36 positions shown; findings below may reference images not displayed]

FINDINGS: Heart is normal size.  Coronary artery calcifications
diffusely.  Aorta is normal caliber.

Low lung volumes.  There is bibasilar atelectasis.  No pleural
effusions.

Borderline sized right paratracheal lymph nodes.  No axillary or
hilar adenopathy. Visualized thyroid and chest wall soft tissues
unremarkable.

Imaging into the upper abdomen shows no acute findings.  Gallstones
noted within the gallbladder.

No acute bony abnormality.
IMPRESSION: Low lung volumes, bibasilar atelectasis.

Coronary artery disease.

Borderline sized right paratracheal lymph nodes, presumably
reactive.

Cholelithiasis.

## 2015-01-19 SURGERY — PHACOEMULSIFICATION, CATARACT, WITH IOL INSERTION
Anesthesia: Monitor Anesthesia Care | Site: Eye | Laterality: Right

## 2015-01-19 MED ORDER — MIDAZOLAM HCL 2 MG/2ML IJ SOLN
INTRAMUSCULAR | Status: AC
  Filled 2015-01-19: qty 2

## 2015-01-19 MED ORDER — KETOROLAC TROMETHAMINE 0.5 % OP SOLN
1.0000 [drp] | OPHTHALMIC | Status: AC
  Administered 2015-01-19 (×3): 1 [drp] via OPHTHALMIC

## 2015-01-19 MED ORDER — LACTATED RINGERS IV SOLN
INTRAVENOUS | Status: DC
  Administered 2015-01-19: 1000 mL via INTRAVENOUS

## 2015-01-19 MED ORDER — PROVISC 10 MG/ML IO SOLN
INTRAOCULAR | Status: DC | PRN
  Administered 2015-01-19: 0.85 mL via INTRAOCULAR

## 2015-01-19 MED ORDER — MIDAZOLAM HCL 2 MG/2ML IJ SOLN
1.0000 mg | INTRAMUSCULAR | Status: DC | PRN
  Administered 2015-01-19: 2 mg via INTRAVENOUS

## 2015-01-19 MED ORDER — CYCLOPENTOLATE-PHENYLEPHRINE 0.2-1 % OP SOLN
1.0000 [drp] | OPHTHALMIC | Status: AC
  Administered 2015-01-19 (×3): 1 [drp] via OPHTHALMIC

## 2015-01-19 MED ORDER — EPINEPHRINE HCL 1 MG/ML IJ SOLN
INTRAOCULAR | Status: DC | PRN
  Administered 2015-01-19: 500 mL

## 2015-01-19 MED ORDER — TETRACAINE HCL 0.5 % OP SOLN
1.0000 [drp] | OPHTHALMIC | Status: AC
  Administered 2015-01-19 (×3): 1 [drp] via OPHTHALMIC

## 2015-01-19 MED ORDER — TETRACAINE 0.5 % OP SOLN OPTIME - NO CHARGE
OPHTHALMIC | Status: DC | PRN
  Administered 2015-01-19: 1 [drp] via OPHTHALMIC

## 2015-01-19 MED ORDER — FENTANYL CITRATE (PF) 100 MCG/2ML IJ SOLN
INTRAMUSCULAR | Status: AC
  Filled 2015-01-19: qty 2

## 2015-01-19 MED ORDER — EPINEPHRINE HCL 1 MG/ML IJ SOLN
INTRAMUSCULAR | Status: AC
  Filled 2015-01-19: qty 1

## 2015-01-19 MED ORDER — BSS IO SOLN
INTRAOCULAR | Status: DC | PRN
  Administered 2015-01-19: 15 mL via INTRAOCULAR

## 2015-01-19 MED ORDER — FENTANYL CITRATE (PF) 100 MCG/2ML IJ SOLN
25.0000 ug | INTRAMUSCULAR | Status: AC
  Administered 2015-01-19 (×2): 25 ug via INTRAVENOUS

## 2015-01-19 MED ORDER — PHENYLEPHRINE HCL 2.5 % OP SOLN
1.0000 [drp] | OPHTHALMIC | Status: AC
  Administered 2015-01-19 (×3): 1 [drp] via OPHTHALMIC

## 2015-01-19 MED ORDER — LIDOCAINE HCL (PF) 1 % IJ SOLN
INTRAMUSCULAR | Status: AC
  Filled 2015-01-19: qty 2

## 2015-01-19 SURGICAL SUPPLY — 24 items
CAPSULAR TENSION RING-AMO (OPHTHALMIC RELATED) IMPLANT
CLOTH BEACON ORANGE TIMEOUT ST (SAFETY) ×2 IMPLANT
EYE SHIELD UNIVERSAL CLEAR (GAUZE/BANDAGES/DRESSINGS) ×2 IMPLANT
GLOVE BIO SURGEON STRL SZ 6.5 (GLOVE) ×2 IMPLANT
GLOVE ECLIPSE 6.5 STRL STRAW (GLOVE) IMPLANT
GLOVE ECLIPSE 7.0 STRL STRAW (GLOVE) IMPLANT
GLOVE EXAM NITRILE LRG STRL (GLOVE) IMPLANT
GLOVE EXAM NITRILE MD LF STRL (GLOVE) ×2 IMPLANT
GLOVE SKINSENSE NS SZ6.5 (GLOVE)
GLOVE SKINSENSE STRL SZ6.5 (GLOVE) IMPLANT
HEALON 5 0.6 ML (INTRAOCULAR LENS) IMPLANT
KIT VITRECTOMY (OPHTHALMIC RELATED) IMPLANT
LENS ALC ACRYL/TECN (Ophthalmic Related) ×2 IMPLANT
PAD ARMBOARD 7.5X6 YLW CONV (MISCELLANEOUS) ×2 IMPLANT
PROC W NO LENS (INTRAOCULAR LENS)
PROC W SPEC LENS (INTRAOCULAR LENS)
PROCESS W NO LENS (INTRAOCULAR LENS) IMPLANT
PROCESS W SPEC LENS (INTRAOCULAR LENS) IMPLANT
RETRACTOR IRIS SIGHTPATH (OPHTHALMIC RELATED) IMPLANT
RING MALYGIN (MISCELLANEOUS) IMPLANT
TAPE SURG TRANSPORE 1 IN (GAUZE/BANDAGES/DRESSINGS) ×1 IMPLANT
TAPE SURGICAL TRANSPORE 1 IN (GAUZE/BANDAGES/DRESSINGS) ×1
VISCOELASTIC ADDITIONAL (OPHTHALMIC RELATED) IMPLANT
WATER STERILE IRR 250ML POUR (IV SOLUTION) ×2 IMPLANT

## 2015-01-19 NOTE — Op Note (Signed)
Patient brought to the operating room and prepped and draped in the usual manner.  Lid speculum inserted in right eye.  Stab incision made at the twelve o'clock position.  Provisc instilled in the anterior chamber.   A 2.4 mm. Stab incision was made temporally.  An anterior capsulotomy was done with a bent 25 gauge needle.  The nucleus was hydrodissected.  The Phaco tip was inserted in the anterior chamber and the nucleus was emulsified.  CDE was 6.69.  The cortical material was then removed with the I and A tip.  Posterior capsule was the polished.  The anterior chamber was deepened with Provisc.  A 22.5 Diopter Alcon [id number] IOL was then inserted in the capsular bag.  Provisc was then removed with the I and A tip.  The wound was then hydrated.  Patient sent to the Recovery Room in good condition with follow up in my office.  Preoperative Diagnosis:  Nuclear Cataract OD Postoperative Diagnosis:  Same Procedure name: Kelman Phacoemulsification OD with IOL

## 2015-01-19 NOTE — Discharge Instructions (Signed)
° ° °  PATIENT INSTRUCTIONS POST-ANESTHESIA  IMMEDIATELY FOLLOWING SURGERY:  Do not drive or operate machinery for the first twenty four hours after surgery.  Do not make any important decisions for twenty four hours after surgery or while taking narcotic pain medications or sedatives.  If you develop intractable nausea and vomiting or a severe headache please notify your doctor immediately.  FOLLOW-UP:  Please make an appointment with your surgeon as instructed. You do not need to follow up with anesthesia unless specifically instructed to do so.  WOUND CARE INSTRUCTIONS (if applicable):  Keep a dry clean dressing on the anesthesia/puncture wound site if there is drainage.  Once the wound has quit draining you may leave it open to air.  Generally you should leave the bandage intact for twenty four hours unless there is drainage.  If the epidural site drains for more than 36-48 hours please call the anesthesia department.  QUESTIONS?:  Please feel free to call your physician or the hospital operator if you have any questions, and they will be happy to assist you.        Joseeduardo Brix  01/19/2015           [facility] Instructions [address] [place] [address]      1. Avoid closing eyes tightly. One often closes the eye tightly when laughing, talking, sneezing, coughing or if they feel irritated. At these times, you should be careful not to close your eyes tightly.  2. Instill eye drops as instructed. To instill drops in your eye, open it, look up and have someone gently pull the lower lid down and instill a couple of drops inside the lower lid.  3. Do not touch upper lid.  4. Take Advil or Tylenol for pain.  5. You may use either eye for near work, such as reading or sewing and you may watch television.  6. You may have your hair done at the beauty parlor at any time.  7. Wear dark glasses with or without your own glasses if you are in bright  light.  8. Call our office at [phone] or [phone] if you have sharp pain in your eye or unusual symptoms.  9. Do not be concerned because vision in the operative eye is not good. It will not be good, no matter how successful the operation, until you get a special lens for it. Your old glasses will not be suited to the new eye that was operated on and you will not be ready for a new lens for about a month.  10. Follow up at the [facility] office.    I have received a copy of the above instructions and will follow them.

## 2015-01-19 NOTE — Transfer of Care (Signed)
Immediate Anesthesia Transfer of Care Note  Patient: Jeffrey Sosa  Procedure(s) Performed: Procedure(s): CATARACT EXTRACTION PHACO AND INTRAOCULAR LENS PLACEMENT; CDE:  6.69 (Right)  Patient Location: PACU  Anesthesia Type:MAC  Level of Consciousness: awake, alert , oriented and patient cooperative  Airway & Oxygen Therapy: Patient Spontanous Breathing  Post-op Assessment: Report given to RN, Post -op Vital signs reviewed and stable and Patient moving all extremities  Post vital signs: Reviewed and stable  Last Vitals:  Filed Vitals:   01/19/15 0715  BP: 125/75  Temp:   Resp: 22    Complications: No apparent anesthesia complications

## 2015-01-19 NOTE — Anesthesia Postprocedure Evaluation (Signed)
  Anesthesia Post-op Note  Patient: Jeffrey Sosa  Procedure(s) Performed: Procedure(s): CATARACT EXTRACTION PHACO AND INTRAOCULAR LENS PLACEMENT; CDE:  6.69 (Right)  Patient Location: PACU  Anesthesia Type:MAC  Level of Consciousness: awake, alert , oriented and patient cooperative  Airway and Oxygen Therapy: Patient Spontanous Breathing  Post-op Pain: none  Post-op Assessment: Post-op Vital signs reviewed, Patient's Cardiovascular Status Stable, Respiratory Function Stable, Patent Airway and Pain level controlled              Post-op Vital Signs: Reviewed and stable  Last Vitals:  Filed Vitals:   01/19/15 0715  BP: 125/75  Temp:   Resp: 22    Complications: No apparent anesthesia complications

## 2015-01-19 NOTE — H&P (Signed)
The patient was re examined and there is no change in the patients condition since the original H and P. 

## 2015-01-19 NOTE — Anesthesia Preprocedure Evaluation (Signed)
Anesthesia Evaluation  Patient identified by MRN, date of birth, ID band Patient awake    Reviewed: Allergy & Precautions, NPO status , Patient's Chart, lab work & pertinent test results  Airway Mallampati: II  TM Distance: >3 FB     Dental  (+) Edentulous Lower, Partial Lower   Pulmonary shortness of breath, Current Smoker,  breath sounds clear to auscultation        Cardiovascular negative cardio ROS  Rhythm:Regular Rate:Normal     Neuro/Psych    GI/Hepatic   Endo/Other  diabetes, Type 2, Oral Hypoglycemic Agents  Renal/GU      Musculoskeletal   Abdominal   Peds  Hematology   Anesthesia Other Findings   Reproductive/Obstetrics                             Anesthesia Physical Anesthesia Plan  ASA: II  Anesthesia Plan: MAC   Post-op Pain Management:    Induction: Intravenous  Airway Management Planned: Nasal Cannula  Additional Equipment:   Intra-op Plan:   Post-operative Plan:   Informed Consent: I have reviewed the patients History and Physical, chart, labs and discussed the procedure including the risks, benefits and alternatives for the proposed anesthesia with the patient or authorized representative who has indicated his/her understanding and acceptance.     Plan Discussed with:   Anesthesia Plan Comments:         Anesthesia Quick Evaluation

## 2015-01-20 ENCOUNTER — Encounter (HOSPITAL_COMMUNITY): Payer: Self-pay | Admitting: Ophthalmology

## 2015-01-28 ENCOUNTER — Inpatient Hospital Stay (HOSPITAL_COMMUNITY): Admission: RE | Admit: 2015-01-28 | Payer: Self-pay | Source: Ambulatory Visit

## 2015-02-02 ENCOUNTER — Ambulatory Visit (HOSPITAL_COMMUNITY): Admission: RE | Admit: 2015-02-02 | Payer: Medicare Other | Source: Ambulatory Visit | Admitting: Ophthalmology

## 2015-02-02 ENCOUNTER — Encounter (HOSPITAL_COMMUNITY): Admission: RE | Payer: Self-pay | Source: Ambulatory Visit

## 2015-02-02 SURGERY — PHACOEMULSIFICATION, CATARACT, WITH IOL INSERTION
Anesthesia: Monitor Anesthesia Care | Laterality: Left

## 2015-07-22 DIAGNOSIS — H2512 Age-related nuclear cataract, left eye: Secondary | ICD-10-CM | POA: Diagnosis not present

## 2015-07-22 DIAGNOSIS — E113313 Type 2 diabetes mellitus with moderate nonproliferative diabetic retinopathy with macular edema, bilateral: Secondary | ICD-10-CM | POA: Diagnosis not present

## 2015-07-22 DIAGNOSIS — Z961 Presence of intraocular lens: Secondary | ICD-10-CM | POA: Diagnosis not present

## 2015-08-17 DIAGNOSIS — E1165 Type 2 diabetes mellitus with hyperglycemia: Secondary | ICD-10-CM | POA: Diagnosis not present

## 2015-08-17 DIAGNOSIS — I10 Essential (primary) hypertension: Secondary | ICD-10-CM | POA: Diagnosis not present

## 2015-08-17 DIAGNOSIS — Z6833 Body mass index (BMI) 33.0-33.9, adult: Secondary | ICD-10-CM | POA: Diagnosis not present

## 2015-08-25 DIAGNOSIS — E1165 Type 2 diabetes mellitus with hyperglycemia: Secondary | ICD-10-CM | POA: Diagnosis not present

## 2015-08-26 DIAGNOSIS — E113319 Type 2 diabetes mellitus with moderate nonproliferative diabetic retinopathy with macular edema, unspecified eye: Secondary | ICD-10-CM | POA: Diagnosis not present

## 2015-08-26 DIAGNOSIS — E113313 Type 2 diabetes mellitus with moderate nonproliferative diabetic retinopathy with macular edema, bilateral: Secondary | ICD-10-CM | POA: Diagnosis not present

## 2015-09-30 DIAGNOSIS — E113313 Type 2 diabetes mellitus with moderate nonproliferative diabetic retinopathy with macular edema, bilateral: Secondary | ICD-10-CM | POA: Diagnosis not present

## 2015-10-07 DIAGNOSIS — E1165 Type 2 diabetes mellitus with hyperglycemia: Secondary | ICD-10-CM | POA: Diagnosis not present

## 2015-11-04 DIAGNOSIS — E113313 Type 2 diabetes mellitus with moderate nonproliferative diabetic retinopathy with macular edema, bilateral: Secondary | ICD-10-CM | POA: Diagnosis not present

## 2015-11-17 DIAGNOSIS — I10 Essential (primary) hypertension: Secondary | ICD-10-CM | POA: Diagnosis not present

## 2015-11-17 DIAGNOSIS — C61 Malignant neoplasm of prostate: Secondary | ICD-10-CM | POA: Diagnosis not present

## 2015-11-17 DIAGNOSIS — E119 Type 2 diabetes mellitus without complications: Secondary | ICD-10-CM | POA: Diagnosis not present

## 2015-11-17 DIAGNOSIS — Z0000 Encounter for general adult medical examination without abnormal findings: Secondary | ICD-10-CM | POA: Diagnosis not present

## 2015-11-17 DIAGNOSIS — E1165 Type 2 diabetes mellitus with hyperglycemia: Secondary | ICD-10-CM | POA: Diagnosis not present

## 2015-11-17 DIAGNOSIS — Z6833 Body mass index (BMI) 33.0-33.9, adult: Secondary | ICD-10-CM | POA: Diagnosis not present

## 2015-11-22 DIAGNOSIS — E1165 Type 2 diabetes mellitus with hyperglycemia: Secondary | ICD-10-CM | POA: Diagnosis not present

## 2015-11-22 DIAGNOSIS — E785 Hyperlipidemia, unspecified: Secondary | ICD-10-CM | POA: Diagnosis not present

## 2015-11-22 DIAGNOSIS — C61 Malignant neoplasm of prostate: Secondary | ICD-10-CM | POA: Diagnosis not present

## 2015-11-22 DIAGNOSIS — E119 Type 2 diabetes mellitus without complications: Secondary | ICD-10-CM | POA: Diagnosis not present

## 2015-11-23 DIAGNOSIS — E1165 Type 2 diabetes mellitus with hyperglycemia: Secondary | ICD-10-CM | POA: Diagnosis not present

## 2015-11-23 DIAGNOSIS — Z0000 Encounter for general adult medical examination without abnormal findings: Secondary | ICD-10-CM | POA: Diagnosis not present

## 2015-11-23 DIAGNOSIS — E113299 Type 2 diabetes mellitus with mild nonproliferative diabetic retinopathy without macular edema, unspecified eye: Secondary | ICD-10-CM | POA: Diagnosis not present

## 2015-11-23 DIAGNOSIS — Z6830 Body mass index (BMI) 30.0-30.9, adult: Secondary | ICD-10-CM | POA: Diagnosis not present

## 2015-12-03 DIAGNOSIS — E1165 Type 2 diabetes mellitus with hyperglycemia: Secondary | ICD-10-CM | POA: Diagnosis not present

## 2015-12-09 DIAGNOSIS — H2512 Age-related nuclear cataract, left eye: Secondary | ICD-10-CM | POA: Diagnosis not present

## 2015-12-09 DIAGNOSIS — Z961 Presence of intraocular lens: Secondary | ICD-10-CM | POA: Diagnosis not present

## 2015-12-09 DIAGNOSIS — E113313 Type 2 diabetes mellitus with moderate nonproliferative diabetic retinopathy with macular edema, bilateral: Secondary | ICD-10-CM | POA: Diagnosis not present

## 2016-01-18 DIAGNOSIS — E1165 Type 2 diabetes mellitus with hyperglycemia: Secondary | ICD-10-CM | POA: Diagnosis not present

## 2016-01-20 DIAGNOSIS — E113313 Type 2 diabetes mellitus with moderate nonproliferative diabetic retinopathy with macular edema, bilateral: Secondary | ICD-10-CM | POA: Diagnosis not present

## 2016-02-21 DIAGNOSIS — E1165 Type 2 diabetes mellitus with hyperglycemia: Secondary | ICD-10-CM | POA: Diagnosis not present

## 2016-02-21 DIAGNOSIS — E669 Obesity, unspecified: Secondary | ICD-10-CM | POA: Diagnosis not present

## 2016-02-21 DIAGNOSIS — Z0000 Encounter for general adult medical examination without abnormal findings: Secondary | ICD-10-CM | POA: Diagnosis not present

## 2016-02-21 DIAGNOSIS — E119 Type 2 diabetes mellitus without complications: Secondary | ICD-10-CM | POA: Diagnosis not present

## 2016-02-22 DIAGNOSIS — E119 Type 2 diabetes mellitus without complications: Secondary | ICD-10-CM | POA: Diagnosis not present

## 2016-02-22 DIAGNOSIS — E669 Obesity, unspecified: Secondary | ICD-10-CM | POA: Diagnosis not present

## 2016-02-22 DIAGNOSIS — E1165 Type 2 diabetes mellitus with hyperglycemia: Secondary | ICD-10-CM | POA: Diagnosis not present

## 2016-02-22 DIAGNOSIS — C61 Malignant neoplasm of prostate: Secondary | ICD-10-CM | POA: Diagnosis not present

## 2016-02-24 DIAGNOSIS — E1165 Type 2 diabetes mellitus with hyperglycemia: Secondary | ICD-10-CM | POA: Diagnosis not present

## 2016-02-24 DIAGNOSIS — I10 Essential (primary) hypertension: Secondary | ICD-10-CM | POA: Diagnosis not present

## 2016-02-24 DIAGNOSIS — Z6834 Body mass index (BMI) 34.0-34.9, adult: Secondary | ICD-10-CM | POA: Diagnosis not present

## 2016-03-02 DIAGNOSIS — E113312 Type 2 diabetes mellitus with moderate nonproliferative diabetic retinopathy with macular edema, left eye: Secondary | ICD-10-CM | POA: Diagnosis not present

## 2016-03-02 DIAGNOSIS — E113311 Type 2 diabetes mellitus with moderate nonproliferative diabetic retinopathy with macular edema, right eye: Secondary | ICD-10-CM | POA: Diagnosis not present

## 2016-03-10 DIAGNOSIS — E1165 Type 2 diabetes mellitus with hyperglycemia: Secondary | ICD-10-CM | POA: Diagnosis not present

## 2016-04-13 DIAGNOSIS — E113313 Type 2 diabetes mellitus with moderate nonproliferative diabetic retinopathy with macular edema, bilateral: Secondary | ICD-10-CM | POA: Diagnosis not present

## 2016-04-13 DIAGNOSIS — E1165 Type 2 diabetes mellitus with hyperglycemia: Secondary | ICD-10-CM | POA: Diagnosis not present

## 2016-04-26 DIAGNOSIS — E1165 Type 2 diabetes mellitus with hyperglycemia: Secondary | ICD-10-CM | POA: Diagnosis not present

## 2016-05-23 DIAGNOSIS — I10 Essential (primary) hypertension: Secondary | ICD-10-CM | POA: Diagnosis not present

## 2016-05-23 DIAGNOSIS — Z6834 Body mass index (BMI) 34.0-34.9, adult: Secondary | ICD-10-CM | POA: Diagnosis not present

## 2016-05-23 DIAGNOSIS — E1165 Type 2 diabetes mellitus with hyperglycemia: Secondary | ICD-10-CM | POA: Diagnosis not present

## 2016-05-25 DIAGNOSIS — Z961 Presence of intraocular lens: Secondary | ICD-10-CM | POA: Diagnosis not present

## 2016-05-25 DIAGNOSIS — E113311 Type 2 diabetes mellitus with moderate nonproliferative diabetic retinopathy with macular edema, right eye: Secondary | ICD-10-CM | POA: Diagnosis not present

## 2016-05-25 DIAGNOSIS — H25812 Combined forms of age-related cataract, left eye: Secondary | ICD-10-CM | POA: Diagnosis not present

## 2016-05-25 DIAGNOSIS — E113312 Type 2 diabetes mellitus with moderate nonproliferative diabetic retinopathy with macular edema, left eye: Secondary | ICD-10-CM | POA: Diagnosis not present

## 2016-05-25 DIAGNOSIS — E113313 Type 2 diabetes mellitus with moderate nonproliferative diabetic retinopathy with macular edema, bilateral: Secondary | ICD-10-CM | POA: Diagnosis not present

## 2016-06-14 DIAGNOSIS — E1165 Type 2 diabetes mellitus with hyperglycemia: Secondary | ICD-10-CM | POA: Diagnosis not present

## 2016-06-29 DIAGNOSIS — E113312 Type 2 diabetes mellitus with moderate nonproliferative diabetic retinopathy with macular edema, left eye: Secondary | ICD-10-CM | POA: Diagnosis not present

## 2016-06-29 DIAGNOSIS — E113313 Type 2 diabetes mellitus with moderate nonproliferative diabetic retinopathy with macular edema, bilateral: Secondary | ICD-10-CM | POA: Diagnosis not present

## 2016-06-29 DIAGNOSIS — E113311 Type 2 diabetes mellitus with moderate nonproliferative diabetic retinopathy with macular edema, right eye: Secondary | ICD-10-CM | POA: Diagnosis not present

## 2016-06-29 DIAGNOSIS — H11002 Unspecified pterygium of left eye: Secondary | ICD-10-CM | POA: Diagnosis not present

## 2016-06-29 DIAGNOSIS — Z961 Presence of intraocular lens: Secondary | ICD-10-CM | POA: Diagnosis not present

## 2016-06-29 DIAGNOSIS — H25812 Combined forms of age-related cataract, left eye: Secondary | ICD-10-CM | POA: Diagnosis not present

## 2016-08-01 DIAGNOSIS — E1165 Type 2 diabetes mellitus with hyperglycemia: Secondary | ICD-10-CM | POA: Diagnosis not present

## 2016-08-03 DIAGNOSIS — E113313 Type 2 diabetes mellitus with moderate nonproliferative diabetic retinopathy with macular edema, bilateral: Secondary | ICD-10-CM | POA: Diagnosis not present

## 2016-08-24 DIAGNOSIS — Z6835 Body mass index (BMI) 35.0-35.9, adult: Secondary | ICD-10-CM | POA: Diagnosis not present

## 2016-08-24 DIAGNOSIS — I10 Essential (primary) hypertension: Secondary | ICD-10-CM | POA: Diagnosis not present

## 2016-08-24 DIAGNOSIS — E1165 Type 2 diabetes mellitus with hyperglycemia: Secondary | ICD-10-CM | POA: Diagnosis not present

## 2016-09-07 DIAGNOSIS — E113313 Type 2 diabetes mellitus with moderate nonproliferative diabetic retinopathy with macular edema, bilateral: Secondary | ICD-10-CM | POA: Diagnosis not present

## 2016-09-25 DIAGNOSIS — E1165 Type 2 diabetes mellitus with hyperglycemia: Secondary | ICD-10-CM | POA: Diagnosis not present

## 2016-10-12 DIAGNOSIS — Z961 Presence of intraocular lens: Secondary | ICD-10-CM | POA: Diagnosis not present

## 2016-10-12 DIAGNOSIS — H25812 Combined forms of age-related cataract, left eye: Secondary | ICD-10-CM | POA: Diagnosis not present

## 2016-10-12 DIAGNOSIS — E113313 Type 2 diabetes mellitus with moderate nonproliferative diabetic retinopathy with macular edema, bilateral: Secondary | ICD-10-CM | POA: Diagnosis not present

## 2016-10-12 DIAGNOSIS — E113312 Type 2 diabetes mellitus with moderate nonproliferative diabetic retinopathy with macular edema, left eye: Secondary | ICD-10-CM | POA: Diagnosis not present

## 2016-11-11 DIAGNOSIS — E1165 Type 2 diabetes mellitus with hyperglycemia: Secondary | ICD-10-CM | POA: Diagnosis not present

## 2016-11-23 DIAGNOSIS — H25812 Combined forms of age-related cataract, left eye: Secondary | ICD-10-CM | POA: Diagnosis not present

## 2016-11-23 DIAGNOSIS — E113313 Type 2 diabetes mellitus with moderate nonproliferative diabetic retinopathy with macular edema, bilateral: Secondary | ICD-10-CM | POA: Diagnosis not present

## 2016-11-23 DIAGNOSIS — Z961 Presence of intraocular lens: Secondary | ICD-10-CM | POA: Diagnosis not present

## 2016-11-23 DIAGNOSIS — H4060X0 Glaucoma secondary to drugs, unspecified eye, stage unspecified: Secondary | ICD-10-CM | POA: Diagnosis not present

## 2016-12-21 DIAGNOSIS — E785 Hyperlipidemia, unspecified: Secondary | ICD-10-CM | POA: Diagnosis not present

## 2016-12-21 DIAGNOSIS — E119 Type 2 diabetes mellitus without complications: Secondary | ICD-10-CM | POA: Diagnosis not present

## 2016-12-21 DIAGNOSIS — Z0000 Encounter for general adult medical examination without abnormal findings: Secondary | ICD-10-CM | POA: Diagnosis not present

## 2016-12-21 DIAGNOSIS — E1142 Type 2 diabetes mellitus with diabetic polyneuropathy: Secondary | ICD-10-CM | POA: Diagnosis not present

## 2016-12-21 DIAGNOSIS — E669 Obesity, unspecified: Secondary | ICD-10-CM | POA: Diagnosis not present

## 2016-12-21 DIAGNOSIS — E1165 Type 2 diabetes mellitus with hyperglycemia: Secondary | ICD-10-CM | POA: Diagnosis not present

## 2016-12-21 DIAGNOSIS — I10 Essential (primary) hypertension: Secondary | ICD-10-CM | POA: Diagnosis not present

## 2016-12-28 DIAGNOSIS — E113313 Type 2 diabetes mellitus with moderate nonproliferative diabetic retinopathy with macular edema, bilateral: Secondary | ICD-10-CM | POA: Diagnosis not present

## 2016-12-28 DIAGNOSIS — Z961 Presence of intraocular lens: Secondary | ICD-10-CM | POA: Diagnosis not present

## 2016-12-28 DIAGNOSIS — H4060X0 Glaucoma secondary to drugs, unspecified eye, stage unspecified: Secondary | ICD-10-CM | POA: Diagnosis not present

## 2016-12-28 DIAGNOSIS — H25812 Combined forms of age-related cataract, left eye: Secondary | ICD-10-CM | POA: Diagnosis not present

## 2017-01-01 DIAGNOSIS — E1165 Type 2 diabetes mellitus with hyperglycemia: Secondary | ICD-10-CM | POA: Diagnosis not present

## 2017-02-08 DIAGNOSIS — E113312 Type 2 diabetes mellitus with moderate nonproliferative diabetic retinopathy with macular edema, left eye: Secondary | ICD-10-CM | POA: Diagnosis not present

## 2017-02-08 DIAGNOSIS — E113313 Type 2 diabetes mellitus with moderate nonproliferative diabetic retinopathy with macular edema, bilateral: Secondary | ICD-10-CM | POA: Diagnosis not present

## 2017-02-08 DIAGNOSIS — E113311 Type 2 diabetes mellitus with moderate nonproliferative diabetic retinopathy with macular edema, right eye: Secondary | ICD-10-CM | POA: Diagnosis not present

## 2017-02-21 DIAGNOSIS — E1165 Type 2 diabetes mellitus with hyperglycemia: Secondary | ICD-10-CM | POA: Diagnosis not present

## 2017-03-15 DIAGNOSIS — E113313 Type 2 diabetes mellitus with moderate nonproliferative diabetic retinopathy with macular edema, bilateral: Secondary | ICD-10-CM | POA: Diagnosis not present

## 2017-03-15 DIAGNOSIS — E113312 Type 2 diabetes mellitus with moderate nonproliferative diabetic retinopathy with macular edema, left eye: Secondary | ICD-10-CM | POA: Diagnosis not present

## 2017-03-27 DIAGNOSIS — I10 Essential (primary) hypertension: Secondary | ICD-10-CM | POA: Diagnosis not present

## 2017-03-27 DIAGNOSIS — E1165 Type 2 diabetes mellitus with hyperglycemia: Secondary | ICD-10-CM | POA: Diagnosis not present

## 2017-03-27 DIAGNOSIS — E785 Hyperlipidemia, unspecified: Secondary | ICD-10-CM | POA: Diagnosis not present

## 2017-04-06 DIAGNOSIS — E1165 Type 2 diabetes mellitus with hyperglycemia: Secondary | ICD-10-CM | POA: Diagnosis not present

## 2017-04-19 DIAGNOSIS — E113313 Type 2 diabetes mellitus with moderate nonproliferative diabetic retinopathy with macular edema, bilateral: Secondary | ICD-10-CM | POA: Diagnosis not present

## 2017-04-19 DIAGNOSIS — H35371 Puckering of macula, right eye: Secondary | ICD-10-CM | POA: Diagnosis not present

## 2017-04-19 DIAGNOSIS — H43822 Vitreomacular adhesion, left eye: Secondary | ICD-10-CM | POA: Diagnosis not present

## 2017-05-24 DIAGNOSIS — E113313 Type 2 diabetes mellitus with moderate nonproliferative diabetic retinopathy with macular edema, bilateral: Secondary | ICD-10-CM | POA: Diagnosis not present

## 2017-05-29 DIAGNOSIS — E1165 Type 2 diabetes mellitus with hyperglycemia: Secondary | ICD-10-CM | POA: Diagnosis not present

## 2017-06-25 DIAGNOSIS — E119 Type 2 diabetes mellitus without complications: Secondary | ICD-10-CM | POA: Diagnosis not present

## 2017-06-25 DIAGNOSIS — I10 Essential (primary) hypertension: Secondary | ICD-10-CM | POA: Diagnosis not present

## 2017-06-25 DIAGNOSIS — E1165 Type 2 diabetes mellitus with hyperglycemia: Secondary | ICD-10-CM | POA: Diagnosis not present

## 2017-06-25 DIAGNOSIS — E669 Obesity, unspecified: Secondary | ICD-10-CM | POA: Diagnosis not present

## 2017-06-25 DIAGNOSIS — E785 Hyperlipidemia, unspecified: Secondary | ICD-10-CM | POA: Diagnosis not present

## 2017-06-28 DIAGNOSIS — E113313 Type 2 diabetes mellitus with moderate nonproliferative diabetic retinopathy with macular edema, bilateral: Secondary | ICD-10-CM | POA: Diagnosis not present

## 2017-07-18 DIAGNOSIS — E1165 Type 2 diabetes mellitus with hyperglycemia: Secondary | ICD-10-CM | POA: Diagnosis not present

## 2017-08-02 DIAGNOSIS — E113312 Type 2 diabetes mellitus with moderate nonproliferative diabetic retinopathy with macular edema, left eye: Secondary | ICD-10-CM | POA: Diagnosis not present

## 2017-08-02 DIAGNOSIS — E113311 Type 2 diabetes mellitus with moderate nonproliferative diabetic retinopathy with macular edema, right eye: Secondary | ICD-10-CM | POA: Diagnosis not present

## 2017-08-02 DIAGNOSIS — H25812 Combined forms of age-related cataract, left eye: Secondary | ICD-10-CM | POA: Diagnosis not present

## 2017-08-02 DIAGNOSIS — Z961 Presence of intraocular lens: Secondary | ICD-10-CM | POA: Diagnosis not present

## 2017-08-02 DIAGNOSIS — E113313 Type 2 diabetes mellitus with moderate nonproliferative diabetic retinopathy with macular edema, bilateral: Secondary | ICD-10-CM | POA: Diagnosis not present

## 2017-09-04 DIAGNOSIS — E113313 Type 2 diabetes mellitus with moderate nonproliferative diabetic retinopathy with macular edema, bilateral: Secondary | ICD-10-CM | POA: Diagnosis not present

## 2017-09-05 DIAGNOSIS — E1165 Type 2 diabetes mellitus with hyperglycemia: Secondary | ICD-10-CM | POA: Diagnosis not present

## 2017-09-24 DIAGNOSIS — I10 Essential (primary) hypertension: Secondary | ICD-10-CM | POA: Diagnosis not present

## 2017-09-24 DIAGNOSIS — Z6834 Body mass index (BMI) 34.0-34.9, adult: Secondary | ICD-10-CM | POA: Diagnosis not present

## 2017-09-24 DIAGNOSIS — E785 Hyperlipidemia, unspecified: Secondary | ICD-10-CM | POA: Diagnosis not present

## 2017-09-24 DIAGNOSIS — E1165 Type 2 diabetes mellitus with hyperglycemia: Secondary | ICD-10-CM | POA: Diagnosis not present

## 2017-10-11 DIAGNOSIS — E113311 Type 2 diabetes mellitus with moderate nonproliferative diabetic retinopathy with macular edema, right eye: Secondary | ICD-10-CM | POA: Diagnosis not present

## 2017-10-11 DIAGNOSIS — E113312 Type 2 diabetes mellitus with moderate nonproliferative diabetic retinopathy with macular edema, left eye: Secondary | ICD-10-CM | POA: Diagnosis not present

## 2017-10-19 DIAGNOSIS — E1165 Type 2 diabetes mellitus with hyperglycemia: Secondary | ICD-10-CM | POA: Diagnosis not present

## 2017-11-15 DIAGNOSIS — H35371 Puckering of macula, right eye: Secondary | ICD-10-CM | POA: Diagnosis not present

## 2017-11-15 DIAGNOSIS — E113313 Type 2 diabetes mellitus with moderate nonproliferative diabetic retinopathy with macular edema, bilateral: Secondary | ICD-10-CM | POA: Diagnosis not present

## 2017-12-07 DIAGNOSIS — E1165 Type 2 diabetes mellitus with hyperglycemia: Secondary | ICD-10-CM | POA: Diagnosis not present

## 2017-12-08 DIAGNOSIS — E113313 Type 2 diabetes mellitus with moderate nonproliferative diabetic retinopathy with macular edema, bilateral: Secondary | ICD-10-CM | POA: Diagnosis not present

## 2017-12-21 DIAGNOSIS — Z0000 Encounter for general adult medical examination without abnormal findings: Secondary | ICD-10-CM | POA: Diagnosis not present

## 2017-12-25 DIAGNOSIS — E113591 Type 2 diabetes mellitus with proliferative diabetic retinopathy without macular edema, right eye: Secondary | ICD-10-CM | POA: Diagnosis not present

## 2017-12-25 DIAGNOSIS — I10 Essential (primary) hypertension: Secondary | ICD-10-CM | POA: Diagnosis not present

## 2017-12-25 DIAGNOSIS — Z0001 Encounter for general adult medical examination with abnormal findings: Secondary | ICD-10-CM | POA: Diagnosis not present

## 2017-12-25 DIAGNOSIS — E119 Type 2 diabetes mellitus without complications: Secondary | ICD-10-CM | POA: Diagnosis not present

## 2017-12-25 DIAGNOSIS — Z6834 Body mass index (BMI) 34.0-34.9, adult: Secondary | ICD-10-CM | POA: Diagnosis not present

## 2017-12-25 DIAGNOSIS — C61 Malignant neoplasm of prostate: Secondary | ICD-10-CM | POA: Diagnosis not present

## 2017-12-25 DIAGNOSIS — Z0000 Encounter for general adult medical examination without abnormal findings: Secondary | ICD-10-CM | POA: Diagnosis not present

## 2017-12-25 DIAGNOSIS — E1165 Type 2 diabetes mellitus with hyperglycemia: Secondary | ICD-10-CM | POA: Diagnosis not present

## 2017-12-27 DIAGNOSIS — E113313 Type 2 diabetes mellitus with moderate nonproliferative diabetic retinopathy with macular edema, bilateral: Secondary | ICD-10-CM | POA: Diagnosis not present

## 2017-12-27 DIAGNOSIS — H4060X0 Glaucoma secondary to drugs, unspecified eye, stage unspecified: Secondary | ICD-10-CM | POA: Diagnosis not present

## 2017-12-27 DIAGNOSIS — H25812 Combined forms of age-related cataract, left eye: Secondary | ICD-10-CM | POA: Diagnosis not present

## 2017-12-27 DIAGNOSIS — E113312 Type 2 diabetes mellitus with moderate nonproliferative diabetic retinopathy with macular edema, left eye: Secondary | ICD-10-CM | POA: Diagnosis not present

## 2018-01-22 DIAGNOSIS — E1165 Type 2 diabetes mellitus with hyperglycemia: Secondary | ICD-10-CM | POA: Diagnosis not present

## 2018-02-07 DIAGNOSIS — E113313 Type 2 diabetes mellitus with moderate nonproliferative diabetic retinopathy with macular edema, bilateral: Secondary | ICD-10-CM | POA: Diagnosis not present

## 2018-03-12 DIAGNOSIS — I10 Essential (primary) hypertension: Secondary | ICD-10-CM | POA: Diagnosis not present

## 2018-03-12 DIAGNOSIS — Z6834 Body mass index (BMI) 34.0-34.9, adult: Secondary | ICD-10-CM | POA: Diagnosis not present

## 2018-03-12 DIAGNOSIS — E113591 Type 2 diabetes mellitus with proliferative diabetic retinopathy without macular edema, right eye: Secondary | ICD-10-CM | POA: Diagnosis not present

## 2018-03-12 DIAGNOSIS — E1165 Type 2 diabetes mellitus with hyperglycemia: Secondary | ICD-10-CM | POA: Diagnosis not present

## 2018-03-12 DIAGNOSIS — E785 Hyperlipidemia, unspecified: Secondary | ICD-10-CM | POA: Diagnosis not present

## 2018-03-21 DIAGNOSIS — E113313 Type 2 diabetes mellitus with moderate nonproliferative diabetic retinopathy with macular edema, bilateral: Secondary | ICD-10-CM | POA: Diagnosis not present

## 2018-03-21 DIAGNOSIS — E113312 Type 2 diabetes mellitus with moderate nonproliferative diabetic retinopathy with macular edema, left eye: Secondary | ICD-10-CM | POA: Diagnosis not present

## 2018-04-29 DIAGNOSIS — E1165 Type 2 diabetes mellitus with hyperglycemia: Secondary | ICD-10-CM | POA: Diagnosis not present

## 2018-05-02 DIAGNOSIS — Z7984 Long term (current) use of oral hypoglycemic drugs: Secondary | ICD-10-CM | POA: Diagnosis not present

## 2018-05-02 DIAGNOSIS — E113313 Type 2 diabetes mellitus with moderate nonproliferative diabetic retinopathy with macular edema, bilateral: Secondary | ICD-10-CM | POA: Diagnosis not present

## 2018-06-13 DIAGNOSIS — E113591 Type 2 diabetes mellitus with proliferative diabetic retinopathy without macular edema, right eye: Secondary | ICD-10-CM | POA: Diagnosis not present

## 2018-06-13 DIAGNOSIS — E1165 Type 2 diabetes mellitus with hyperglycemia: Secondary | ICD-10-CM | POA: Diagnosis not present

## 2018-06-13 DIAGNOSIS — I10 Essential (primary) hypertension: Secondary | ICD-10-CM | POA: Diagnosis not present

## 2018-06-13 DIAGNOSIS — E785 Hyperlipidemia, unspecified: Secondary | ICD-10-CM | POA: Diagnosis not present

## 2018-06-13 DIAGNOSIS — E119 Type 2 diabetes mellitus without complications: Secondary | ICD-10-CM | POA: Diagnosis not present

## 2018-06-13 DIAGNOSIS — Z6834 Body mass index (BMI) 34.0-34.9, adult: Secondary | ICD-10-CM | POA: Diagnosis not present

## 2018-06-13 DIAGNOSIS — E669 Obesity, unspecified: Secondary | ICD-10-CM | POA: Diagnosis not present

## 2018-06-20 DIAGNOSIS — E113313 Type 2 diabetes mellitus with moderate nonproliferative diabetic retinopathy with macular edema, bilateral: Secondary | ICD-10-CM | POA: Diagnosis not present

## 2018-06-20 DIAGNOSIS — Z961 Presence of intraocular lens: Secondary | ICD-10-CM | POA: Diagnosis not present

## 2018-06-20 DIAGNOSIS — H25812 Combined forms of age-related cataract, left eye: Secondary | ICD-10-CM | POA: Diagnosis not present

## 2018-06-20 DIAGNOSIS — Z7984 Long term (current) use of oral hypoglycemic drugs: Secondary | ICD-10-CM | POA: Diagnosis not present

## 2018-07-14 ENCOUNTER — Emergency Department (HOSPITAL_COMMUNITY): Payer: Medicare Other

## 2018-07-14 ENCOUNTER — Emergency Department (HOSPITAL_COMMUNITY)
Admission: EM | Admit: 2018-07-14 | Discharge: 2018-07-14 | Disposition: A | Discharge status: Home / Self Care | Payer: Medicare Other | Attending: Emergency Medicine | Admitting: Emergency Medicine

## 2018-07-14 ENCOUNTER — Encounter (HOSPITAL_COMMUNITY): Payer: Self-pay | Admitting: Emergency Medicine

## 2018-07-14 ENCOUNTER — Other Ambulatory Visit: Payer: Self-pay

## 2018-07-14 DIAGNOSIS — R0602 Shortness of breath: Secondary | ICD-10-CM | POA: Insufficient documentation

## 2018-07-14 DIAGNOSIS — R05 Cough: Secondary | ICD-10-CM | POA: Diagnosis not present

## 2018-07-14 DIAGNOSIS — J069 Acute upper respiratory infection, unspecified: Secondary | ICD-10-CM | POA: Diagnosis not present

## 2018-07-14 DIAGNOSIS — F17210 Nicotine dependence, cigarettes, uncomplicated: Secondary | ICD-10-CM | POA: Insufficient documentation

## 2018-07-14 DIAGNOSIS — B9789 Other viral agents as the cause of diseases classified elsewhere: Secondary | ICD-10-CM | POA: Diagnosis not present

## 2018-07-14 DIAGNOSIS — E119 Type 2 diabetes mellitus without complications: Secondary | ICD-10-CM | POA: Insufficient documentation

## 2018-07-14 DIAGNOSIS — R059 Cough, unspecified: Secondary | ICD-10-CM

## 2018-07-14 LAB — COMPREHENSIVE METABOLIC PANEL
ALBUMIN: 3.7 g/dL (ref 3.5–5.0)
ALT: 42 U/L (ref 0–44)
AST: 29 U/L (ref 15–41)
Alkaline Phosphatase: 49 U/L (ref 38–126)
Anion gap: 9 (ref 5–15)
BUN: 17 mg/dL (ref 8–23)
CHLORIDE: 100 mmol/L (ref 98–111)
CO2: 29 mmol/L (ref 22–32)
Calcium: 9.2 mg/dL (ref 8.9–10.3)
Creatinine, Ser: 0.86 mg/dL (ref 0.61–1.24)
GFR calc Af Amer: >60 mL/min (ref >60)
GFR calc non Af Amer: >60 mL/min (ref >60)
GLUCOSE: 184 mg/dL — AB (ref 70–99)
Potassium: 4.4 mmol/L (ref 3.5–5.1)
Sodium: 138 mmol/L (ref 135–145)
Total Bilirubin: 0.6 mg/dL (ref 0.3–1.2)
Total Protein: 7.2 g/dL (ref 6.5–8.1)

## 2018-07-14 LAB — CBC
HCT: 44.5 % (ref 39.0–52.0)
Hemoglobin: 13.9 g/dL (ref 13.0–17.0)
MCH: 28.9 pg (ref 26.0–34.0)
MCHC: 31.2 g/dL (ref 30.0–36.0)
MCV: 92.5 fL (ref 80.0–100.0)
NRBC: 0 % (ref 0.0–0.2)
PLATELETS: 262 10*3/uL (ref 150–400)
RBC: 4.81 MIL/uL (ref 4.22–5.81)
RDW: 12.2 % (ref 11.5–15.5)
WBC: 10.3 10*3/uL (ref 4.0–10.5)

## 2018-07-14 LAB — INFLUENZA PANEL BY PCR (TYPE A & B)
Influenza A By PCR: NEGATIVE
Influenza B By PCR: NEGATIVE

## 2018-07-14 LAB — TROPONIN I: Troponin I: <0.03 ng/mL (ref <0.03)

## 2018-07-14 MED ORDER — BENZONATATE 100 MG PO CAPS: 100.0000 mg | ORAL_CAPSULE | Freq: Three times a day (TID) | ORAL | 0 refills | Status: DC | PRN

## 2018-07-14 MED ORDER — DEXAMETHASONE SODIUM PHOSPHATE 10 MG/ML IJ SOLN
10.0000 mg | Freq: Once | INTRAMUSCULAR | Status: AC
  Administered 2018-07-14: 10 mg via INTRAMUSCULAR
  Filled 2018-07-14: qty 1

## 2018-07-14 MED ORDER — ALBUTEROL SULFATE HFA 108 (90 BASE) MCG/ACT IN AERS
2.0000 | INHALATION_SPRAY | Freq: Once | RESPIRATORY_TRACT | Status: AC
  Administered 2018-07-14: 2 via RESPIRATORY_TRACT
  Filled 2018-07-14: qty 6.7

## 2018-07-14 NOTE — Discharge Instructions (Signed)

## 2018-07-14 NOTE — ED Notes (Signed)
Pt ambulated well. Pt's O2 sats stayed at 93 and 94.

## 2018-07-14 NOTE — ED Triage Notes (Signed)
Pt c/o cough, sob, body aches, chills since Thursday.

## 2018-07-14 NOTE — ED Provider Notes (Signed)
Emergency Department Provider Note   I have reviewed the triage vital signs and the nursing notes.   HISTORY  Chief Complaint Cough and Shortness of Breath   HPI Jeffrey Sosa is a 68 y.o. male with PMH of DM presents to the emergency department with cough, congestion, shortness of breath.  Symptoms began 2 days ago and have progressively worsened.  Patient describes mainly nasal congestion with mildly productive cough.  No hemoptysis.  Patient with chest pain but only with coughing.  He notes some mild shortness of breath that is worse with exertion.  No lower extremity edema.  He is unsure regarding fevers and has been experiencing muscle aches mainly in his neck which are worse with coughing.  No vomiting or diarrhea.  No radiation of symptoms or other modifying factors. No UTI symptoms.   Past Medical History:  Diagnosis Date  . Diabetes mellitus without complication [facility](HCC)     Patient Active Problem List   Diagnosis Date Noted  . Dyspnea 12/19/2012  . Chest wall pain 12/19/2012  . CAP (community acquired pneumonia) 12/19/2012  . Diabetes mellitus, type 2 (HCC) 12/19/2012  . Obesity, unspecified 12/19/2012    Past Surgical History:  Procedure Laterality Date  . CATARACT EXTRACTION W/PHACO Right 01/19/2015   Procedure: CATARACT EXTRACTION PHACO AND INTRAOCULAR LENS PLACEMENT; CDE:  6.69;  Surgeon: Jethro BolusMark Shapiro, MD;  Location: [facility];  Service: Ophthalmology;  Laterality: Right;  . WRIST SURGERY Right 1996   Allergies Patient has no known allergies.  Family History  Problem Relation Age of Onset  . Cancer Sister        uterus  . Dementia Mother     Social History Social History   Tobacco Use  . Smoking status: Current Every Day Smoker    Types: Cigars  . Smokeless tobacco: Never Used  . Tobacco comment: i cigar 2-3/week  Substance Use Topics  . Alcohol use: Yes    Alcohol/week: 14.0 standard drinks    Types: 14 Standard drinks or equivalent per week   Comment: daily  . Drug use: No    Review of Systems  Constitutional: No fever/chills. Positive fatigue.  Eyes: No visual changes. ENT: No sore throat. Positive nasal congestion.  Cardiovascular: Positive CP with cough only.  Respiratory: Positive SOB and cough.  Gastrointestinal: No abdominal pain.  No nausea, no vomiting.  No diarrhea.  No constipation. Genitourinary: Negative for dysuria. Musculoskeletal: Negative for back pain. Skin: Negative for rash. Neurological: Negative for focal weakness or numbness. Positive mild HA.   10-point ROS otherwise negative.  ____________________________________________   PHYSICAL EXAM:  VITAL SIGNS: ED Triage Vitals  Enc Vitals Group     BP 07/14/18 1709 (!) 137/94     Pulse Rate 07/14/18 1709 98     Resp 07/14/18 1709 (!) 22     Temp 07/14/18 1709 98.3 F (36.8 C)     Temp Source 07/14/18 1709 Oral     SpO2 07/14/18 1708 97 %     Weight 07/14/18 1709 218 lb (98.9 kg)     Height 07/14/18 1709 5\' 7"  (1.702 m)     Pain Score 07/14/18 1709 6   Constitutional: Alert and oriented. Well appearing and in no acute distress. Eyes: Conjunctivae are normal.  Head: Atraumatic. Nose: Positive congestion/rhinnorhea. Mouth/Throat: Mucous membranes are moist Neck: No stridor.  No meningeal signs.   Cardiovascular: Normal rate, regular rhythm. Good peripheral circulation. Grossly normal heart sounds.   Respiratory: Increased respiratory effort.  No  retractions. Lungs with faint end-expiratory wheezing bilaterally. No rales.  Gastrointestinal: Soft and nontender. No distention.  Musculoskeletal: No lower extremity tenderness nor edema. No gross deformities of extremities. Neurologic:  Normal speech and language. No gross focal neurologic deficits are appreciated.  Skin:  Skin is warm, dry and intact. No rash noted.  ____________________________________________   LABS (all labs ordered are listed, but only abnormal results are  displayed)  Labs Reviewed  COMPREHENSIVE METABOLIC PANEL - Abnormal; Notable for the following components:      Result Value   Glucose, Bld 184 (*)    All other components within normal limits  CBC  INFLUENZA PANEL BY PCR (TYPE A & B)  TROPONIN I   ____________________________________________  EKG   EKG Interpretation  Date/Time:  Sunday July 14 2018 17:39:53 EST Ventricular Rate:  95 PR Interval:    QRS Duration: 100 QT Interval:  331 QTC Calculation: 416 R Axis:   54 Text Interpretation:  Sinus rhythm No STEMI.  Confirmed by Alona Bene [phone]) on 07/14/2018 5:42:05 PM       ____________________________________________  RADIOLOGY  Dg Chest 2 View  Result Date: 07/14/2018 CLINICAL DATA:  Short of breath and cough today. EXAM: CHEST - 2 VIEW COMPARISON:  12/19/2012 FINDINGS: Cardiac silhouette is normal in size. No mediastinal or hilar masses or evidence of adenopathy. Lung volumes low with lung base bronchovascular crowding and mild atelectasis. No evidence of pneumonia or pulmonary edema. No pleural effusion or pneumothorax. Skeletal structures are intact. IMPRESSION: No active cardiopulmonary disease. Electronically Signed   By: Amie Portland M.D.   On: 07/14/2018 17:36    ____________________________________________   PROCEDURES  Procedure(s) performed:   Procedures  None ____________________________________________   INITIAL IMPRESSION / ASSESSMENT AND PLAN / ED COURSE  Pertinent labs & imaging results that were available during my care of the patient were reviewed by me and considered in my medical decision making (see chart for details).  Patient presents to the emergency department with 2 to 3 days of flulike symptoms which are gradually worsening.  He describes some shortness of breath with faint wheezing on exam.  Plan for albuterol inhaler along with screening labs.   07:20 PM Lab work is largely unremarkable including troponin, negative flu.   Patient has no UTI symptoms.  UA canceled which was ordered from triage.  Patient ambulated with pulse ox and did not become significantly dyspneic or hypoxemic.  Lowest O2 sat while ambulating on room air was 94%.  He is feeling well and is wishing to go home.  Plan for Decadron here along with Tessalon, albuterol, Mucinex at home.  Discussed return to the emergency department immediately if any new or worsening symptoms develop.  I feel that ACS and/or PE is very unlikely in the setting of congestion and viral URI symptoms.   ____________________________________________  FINAL CLINICAL IMPRESSION(S) / ED DIAGNOSES  Final diagnoses:  Shortness of breath  Cough  Viral upper respiratory tract infection     MEDICATIONS GIVEN DURING THIS VISIT:  Medications  dexamethasone (DECADRON) injection 10 mg (has no administration in time range)  albuterol (PROVENTIL HFA;VENTOLIN HFA) 108 (90 Base) MCG/ACT inhaler 2 puff (2 puffs Inhalation Given 07/14/18 1822)     NEW OUTPATIENT MEDICATIONS STARTED DURING THIS VISIT:  New Prescriptions   BENZONATATE (TESSALON) 100 MG CAPSULE    Take 1 capsule (100 mg total) by mouth 3 (three) times daily as needed for cough.    Note:  This document was prepared using  [employer] and may include unintentional dictation errors.  Alona Bene, MD Emergency Medicine    Freddrick Gladson, Arlyss Repress, MD 07/14/18 [phone]

## 2018-07-14 NOTE — ED Notes (Addendum)
Wife at bedside.

## 2018-07-15 DIAGNOSIS — E113591 Type 2 diabetes mellitus with proliferative diabetic retinopathy without macular edema, right eye: Secondary | ICD-10-CM | POA: Diagnosis not present

## 2018-07-15 DIAGNOSIS — R0602 Shortness of breath: Secondary | ICD-10-CM | POA: Diagnosis not present

## 2018-07-15 DIAGNOSIS — R05 Cough: Secondary | ICD-10-CM | POA: Diagnosis not present

## 2018-07-15 DIAGNOSIS — J069 Acute upper respiratory infection, unspecified: Secondary | ICD-10-CM | POA: Diagnosis not present

## 2018-08-01 DIAGNOSIS — E113313 Type 2 diabetes mellitus with moderate nonproliferative diabetic retinopathy with macular edema, bilateral: Secondary | ICD-10-CM | POA: Diagnosis not present

## 2018-08-05 DIAGNOSIS — E1165 Type 2 diabetes mellitus with hyperglycemia: Secondary | ICD-10-CM | POA: Diagnosis not present

## 2018-08-16 DIAGNOSIS — E1165 Type 2 diabetes mellitus with hyperglycemia: Secondary | ICD-10-CM | POA: Diagnosis not present

## 2018-08-16 DIAGNOSIS — I10 Essential (primary) hypertension: Secondary | ICD-10-CM | POA: Diagnosis not present

## 2018-09-03 DIAGNOSIS — E785 Hyperlipidemia, unspecified: Secondary | ICD-10-CM | POA: Diagnosis not present

## 2018-09-03 DIAGNOSIS — I10 Essential (primary) hypertension: Secondary | ICD-10-CM | POA: Diagnosis not present

## 2018-09-03 DIAGNOSIS — E1142 Type 2 diabetes mellitus with diabetic polyneuropathy: Secondary | ICD-10-CM | POA: Diagnosis not present

## 2018-09-03 DIAGNOSIS — Z6834 Body mass index (BMI) 34.0-34.9, adult: Secondary | ICD-10-CM | POA: Diagnosis not present

## 2018-09-12 DIAGNOSIS — H25812 Combined forms of age-related cataract, left eye: Secondary | ICD-10-CM | POA: Diagnosis not present

## 2018-09-12 DIAGNOSIS — H43391 Other vitreous opacities, right eye: Secondary | ICD-10-CM | POA: Diagnosis not present

## 2018-09-12 DIAGNOSIS — Z961 Presence of intraocular lens: Secondary | ICD-10-CM | POA: Diagnosis not present

## 2018-09-12 DIAGNOSIS — E113313 Type 2 diabetes mellitus with moderate nonproliferative diabetic retinopathy with macular edema, bilateral: Secondary | ICD-10-CM | POA: Diagnosis not present

## 2018-10-17 DIAGNOSIS — H4061X0 Glaucoma secondary to drugs, right eye, stage unspecified: Secondary | ICD-10-CM | POA: Diagnosis not present

## 2018-10-17 DIAGNOSIS — H25812 Combined forms of age-related cataract, left eye: Secondary | ICD-10-CM | POA: Diagnosis not present

## 2018-10-17 DIAGNOSIS — E113313 Type 2 diabetes mellitus with moderate nonproliferative diabetic retinopathy with macular edema, bilateral: Secondary | ICD-10-CM | POA: Diagnosis not present

## 2018-10-17 DIAGNOSIS — Z961 Presence of intraocular lens: Secondary | ICD-10-CM | POA: Diagnosis not present

## 2018-10-21 DIAGNOSIS — H4061X4 Glaucoma secondary to drugs, right eye, indeterminate stage: Secondary | ICD-10-CM | POA: Diagnosis not present

## 2018-10-21 DIAGNOSIS — T380X5A Adverse effect of glucocorticoids and synthetic analogues, initial encounter: Secondary | ICD-10-CM | POA: Diagnosis not present

## 2018-11-08 DIAGNOSIS — E1165 Type 2 diabetes mellitus with hyperglycemia: Secondary | ICD-10-CM | POA: Diagnosis not present

## 2018-11-18 DIAGNOSIS — H4061X4 Glaucoma secondary to drugs, right eye, indeterminate stage: Secondary | ICD-10-CM | POA: Diagnosis not present

## 2018-11-18 DIAGNOSIS — H4060X0 Glaucoma secondary to drugs, unspecified eye, stage unspecified: Secondary | ICD-10-CM | POA: Diagnosis not present

## 2018-11-18 DIAGNOSIS — T380X5A Adverse effect of glucocorticoids and synthetic analogues, initial encounter: Secondary | ICD-10-CM | POA: Diagnosis not present

## 2018-11-28 DIAGNOSIS — E113313 Type 2 diabetes mellitus with moderate nonproliferative diabetic retinopathy with macular edema, bilateral: Secondary | ICD-10-CM | POA: Diagnosis not present

## 2018-12-02 ENCOUNTER — Other Ambulatory Visit: Payer: Self-pay

## 2018-12-02 ENCOUNTER — Other Ambulatory Visit (HOSPITAL_COMMUNITY)
Admission: RE | Admit: 2018-12-02 | Discharge: 2018-12-02 | Disposition: A | Discharge status: Home / Self Care | Payer: Medicare Other | Source: Ambulatory Visit | Attending: Internal Medicine | Admitting: Internal Medicine

## 2018-12-02 DIAGNOSIS — Z6834 Body mass index (BMI) 34.0-34.9, adult: Secondary | ICD-10-CM | POA: Insufficient documentation

## 2018-12-02 DIAGNOSIS — E113591 Type 2 diabetes mellitus with proliferative diabetic retinopathy without macular edema, right eye: Secondary | ICD-10-CM | POA: Diagnosis not present

## 2018-12-02 DIAGNOSIS — I10 Essential (primary) hypertension: Secondary | ICD-10-CM | POA: Insufficient documentation

## 2018-12-02 DIAGNOSIS — Z0001 Encounter for general adult medical examination with abnormal findings: Secondary | ICD-10-CM | POA: Diagnosis not present

## 2018-12-02 DIAGNOSIS — E785 Hyperlipidemia, unspecified: Secondary | ICD-10-CM | POA: Diagnosis not present

## 2018-12-02 LAB — CBC WITH DIFFERENTIAL/PLATELET
Abs Immature Granulocytes: 0.03 10*3/uL (ref 0.00–0.07)
Basophils Absolute: 0.1 10*3/uL (ref 0.0–0.1)
Basophils Relative: 1 %
Eosinophils Absolute: 0.2 10*3/uL (ref 0.0–0.5)
Eosinophils Relative: 2 %
HCT: 46.8 % (ref 39.0–52.0)
Hemoglobin: 14.7 g/dL (ref 13.0–17.0)
Immature Granulocytes: 0 %
Lymphocytes Relative: 29 %
Lymphs Abs: 2.8 10*3/uL (ref 0.7–4.0)
MCH: 29.2 pg (ref 26.0–34.0)
MCHC: 31.4 g/dL (ref 30.0–36.0)
MCV: 92.9 fL (ref 80.0–100.0)
Monocytes Absolute: 0.9 10*3/uL (ref 0.1–1.0)
Monocytes Relative: 9 %
Neutro Abs: 5.5 10*3/uL (ref 1.7–7.7)
Neutrophils Relative %: 59 %
Platelets: 220 10*3/uL (ref 150–400)
RBC: 5.04 MIL/uL (ref 4.22–5.81)
RDW: 13.1 % (ref 11.5–15.5)
WBC: 9.5 10*3/uL (ref 4.0–10.5)
nRBC: 0 % (ref 0.0–0.2)

## 2018-12-02 LAB — BASIC METABOLIC PANEL
Anion gap: 10 (ref 5–15)
BUN: 16 mg/dL (ref 8–23)
CO2: 26 mmol/L (ref 22–32)
Calcium: 9.2 mg/dL (ref 8.9–10.3)
Chloride: 100 mmol/L (ref 98–111)
Creatinine, Ser: 0.79 mg/dL (ref 0.61–1.24)
GFR calc Af Amer: >60 mL/min (ref >60)
GFR calc non Af Amer: >60 mL/min (ref >60)
Glucose, Bld: 151 mg/dL — ABNORMAL HIGH (ref 70–99)
Potassium: 4.7 mmol/L (ref 3.5–5.1)
Sodium: 136 mmol/L (ref 135–145)

## 2018-12-02 LAB — HEPATIC FUNCTION PANEL
ALT: 25 U/L (ref 0–44)
AST: 20 U/L (ref 15–41)
Albumin: 4.1 g/dL (ref 3.5–5.0)
Alkaline Phosphatase: 34 U/L — ABNORMAL LOW (ref 38–126)
Bilirubin, Direct: 0.2 mg/dL (ref 0.0–0.2)
Indirect Bilirubin: 0.6 mg/dL (ref 0.3–0.9)
Total Bilirubin: 0.8 mg/dL (ref 0.3–1.2)
Total Protein: 7.1 g/dL (ref 6.5–8.1)

## 2018-12-02 LAB — LIPID PANEL
Cholesterol: 142 mg/dL (ref 0–200)
HDL: 50 mg/dL (ref >40)
LDL Cholesterol: 79 mg/dL (ref 0–99)
Total CHOL/HDL Ratio: 2.8 RATIO
Triglycerides: 65 mg/dL (ref <150)
VLDL: 13 mg/dL (ref 0–40)

## 2018-12-03 LAB — HEMOGLOBIN A1C
Hgb A1c MFr Bld: 7.1 % — ABNORMAL HIGH (ref 4.8–5.6)
Mean Plasma Glucose: 157 mg/dL

## 2018-12-03 LAB — MICROALBUMIN, URINE: Microalb, Ur: <3 ug/mL — ABNORMAL HIGH

## 2018-12-04 DIAGNOSIS — H4061X4 Glaucoma secondary to drugs, right eye, indeterminate stage: Secondary | ICD-10-CM | POA: Diagnosis not present

## 2018-12-04 DIAGNOSIS — T380X5A Adverse effect of glucocorticoids and synthetic analogues, initial encounter: Secondary | ICD-10-CM | POA: Diagnosis not present

## 2018-12-11 HISTORY — PX: OTHER SURGICAL HISTORY: SHX169

## 2018-12-23 DIAGNOSIS — T380X5A Adverse effect of glucocorticoids and synthetic analogues, initial encounter: Secondary | ICD-10-CM | POA: Diagnosis not present

## 2018-12-23 DIAGNOSIS — H4061X4 Glaucoma secondary to drugs, right eye, indeterminate stage: Secondary | ICD-10-CM | POA: Diagnosis not present

## 2018-12-25 DIAGNOSIS — H4060X0 Glaucoma secondary to drugs, unspecified eye, stage unspecified: Secondary | ICD-10-CM | POA: Diagnosis not present

## 2018-12-25 DIAGNOSIS — T380X5A Adverse effect of glucocorticoids and synthetic analogues, initial encounter: Secondary | ICD-10-CM | POA: Diagnosis not present

## 2018-12-25 DIAGNOSIS — Z1159 Encounter for screening for other viral diseases: Secondary | ICD-10-CM | POA: Diagnosis not present

## 2018-12-25 DIAGNOSIS — Z01812 Encounter for preprocedural laboratory examination: Secondary | ICD-10-CM | POA: Diagnosis not present

## 2018-12-31 DIAGNOSIS — H4060X0 Glaucoma secondary to drugs, unspecified eye, stage unspecified: Secondary | ICD-10-CM | POA: Diagnosis not present

## 2018-12-31 DIAGNOSIS — T380X5A Adverse effect of glucocorticoids and synthetic analogues, initial encounter: Secondary | ICD-10-CM | POA: Diagnosis not present

## 2018-12-31 DIAGNOSIS — H4061X3 Glaucoma secondary to drugs, right eye, severe stage: Secondary | ICD-10-CM | POA: Diagnosis not present

## 2019-01-01 DIAGNOSIS — E785 Hyperlipidemia, unspecified: Secondary | ICD-10-CM | POA: Diagnosis not present

## 2019-01-01 DIAGNOSIS — E113591 Type 2 diabetes mellitus with proliferative diabetic retinopathy without macular edema, right eye: Secondary | ICD-10-CM | POA: Diagnosis not present

## 2019-01-06 DIAGNOSIS — Z48810 Encounter for surgical aftercare following surgery on the sense organs: Secondary | ICD-10-CM | POA: Diagnosis not present

## 2019-01-06 DIAGNOSIS — H4061X0 Glaucoma secondary to drugs, right eye, stage unspecified: Secondary | ICD-10-CM | POA: Diagnosis not present

## 2019-01-06 DIAGNOSIS — Z9689 Presence of other specified functional implants: Secondary | ICD-10-CM | POA: Diagnosis not present

## 2019-01-06 DIAGNOSIS — T380X5A Adverse effect of glucocorticoids and synthetic analogues, initial encounter: Secondary | ICD-10-CM | POA: Diagnosis not present

## 2019-01-09 DIAGNOSIS — E113313 Type 2 diabetes mellitus with moderate nonproliferative diabetic retinopathy with macular edema, bilateral: Secondary | ICD-10-CM | POA: Diagnosis not present

## 2019-02-13 DIAGNOSIS — E113313 Type 2 diabetes mellitus with moderate nonproliferative diabetic retinopathy with macular edema, bilateral: Secondary | ICD-10-CM | POA: Diagnosis not present

## 2019-03-04 DIAGNOSIS — E785 Hyperlipidemia, unspecified: Secondary | ICD-10-CM | POA: Diagnosis not present

## 2019-03-04 DIAGNOSIS — I10 Essential (primary) hypertension: Secondary | ICD-10-CM | POA: Diagnosis not present

## 2019-03-27 DIAGNOSIS — E113313 Type 2 diabetes mellitus with moderate nonproliferative diabetic retinopathy with macular edema, bilateral: Secondary | ICD-10-CM | POA: Diagnosis not present

## 2019-04-03 DIAGNOSIS — E1165 Type 2 diabetes mellitus with hyperglycemia: Secondary | ICD-10-CM | POA: Diagnosis not present

## 2019-04-03 DIAGNOSIS — I10 Essential (primary) hypertension: Secondary | ICD-10-CM | POA: Diagnosis not present

## 2019-05-06 DIAGNOSIS — Z6834 Body mass index (BMI) 34.0-34.9, adult: Secondary | ICD-10-CM | POA: Diagnosis not present

## 2019-05-06 DIAGNOSIS — E785 Hyperlipidemia, unspecified: Secondary | ICD-10-CM | POA: Diagnosis not present

## 2019-05-06 DIAGNOSIS — I10 Essential (primary) hypertension: Secondary | ICD-10-CM | POA: Diagnosis not present

## 2019-05-06 DIAGNOSIS — E1142 Type 2 diabetes mellitus with diabetic polyneuropathy: Secondary | ICD-10-CM | POA: Diagnosis not present

## 2019-05-15 DIAGNOSIS — E113313 Type 2 diabetes mellitus with moderate nonproliferative diabetic retinopathy with macular edema, bilateral: Secondary | ICD-10-CM | POA: Diagnosis not present

## 2019-06-05 DIAGNOSIS — I10 Essential (primary) hypertension: Secondary | ICD-10-CM | POA: Diagnosis not present

## 2019-06-05 DIAGNOSIS — E1165 Type 2 diabetes mellitus with hyperglycemia: Secondary | ICD-10-CM | POA: Diagnosis not present

## 2019-06-24 ENCOUNTER — Encounter: Payer: Self-pay | Admitting: Internal Medicine

## 2019-07-03 DIAGNOSIS — H4060X0 Glaucoma secondary to drugs, unspecified eye, stage unspecified: Secondary | ICD-10-CM | POA: Diagnosis not present

## 2019-07-03 DIAGNOSIS — T380X5A Adverse effect of glucocorticoids and synthetic analogues, initial encounter: Secondary | ICD-10-CM | POA: Diagnosis not present

## 2019-07-03 DIAGNOSIS — H25812 Combined forms of age-related cataract, left eye: Secondary | ICD-10-CM | POA: Diagnosis not present

## 2019-07-03 DIAGNOSIS — E113313 Type 2 diabetes mellitus with moderate nonproliferative diabetic retinopathy with macular edema, bilateral: Secondary | ICD-10-CM | POA: Diagnosis not present

## 2019-07-06 DIAGNOSIS — E1165 Type 2 diabetes mellitus with hyperglycemia: Secondary | ICD-10-CM | POA: Diagnosis not present

## 2019-07-06 DIAGNOSIS — I10 Essential (primary) hypertension: Secondary | ICD-10-CM | POA: Diagnosis not present

## 2019-07-09 DIAGNOSIS — T380X5A Adverse effect of glucocorticoids and synthetic analogues, initial encounter: Secondary | ICD-10-CM | POA: Diagnosis not present

## 2019-07-09 DIAGNOSIS — H4060X0 Glaucoma secondary to drugs, unspecified eye, stage unspecified: Secondary | ICD-10-CM | POA: Diagnosis not present

## 2019-07-09 DIAGNOSIS — H4061X4 Glaucoma secondary to drugs, right eye, indeterminate stage: Secondary | ICD-10-CM | POA: Diagnosis not present

## 2019-07-25 ENCOUNTER — Other Ambulatory Visit: Payer: Self-pay

## 2019-07-25 ENCOUNTER — Ambulatory Visit (INDEPENDENT_AMBULATORY_CARE_PROVIDER_SITE_OTHER): Payer: Medicare Other | Admitting: Gastroenterology

## 2019-07-25 ENCOUNTER — Telehealth: Payer: Self-pay

## 2019-07-25 ENCOUNTER — Encounter: Payer: Self-pay | Admitting: Gastroenterology

## 2019-07-25 DIAGNOSIS — R195 Other fecal abnormalities: Secondary | ICD-10-CM | POA: Diagnosis not present

## 2019-07-25 MED ORDER — PEG 3350-KCL-NA BICARB-NACL 420 G PO SOLR: 4000.0000 mL | ORAL | 0 refills | Status: AC

## 2019-07-25 NOTE — Progress Notes (Signed)
Primary Care Physician:  Rosita Fire, MD Primary Gastroenterologist:  Dr. Gala Romney  Chief Complaint  Patient presents with  . Blood In Stools    Positive FIT test    HPI:   Jeffrey Sosa is a 69 y.o. male presenting today at the request of Dr. Legrand Rams due to heme positive stools. No prior colonoscopy.   No overt GI bleeding. No chronic GERD. No changes in bowel habits. No dysphagia. No abdominal pain. No N/V. No unexplained weight loss. Since wife passed, his appetite hasn't been the same as prior. He has no GI complaints today.     Past Medical History:  Diagnosis Date  . Diabetes mellitus without complication ([place])   . Diabetic neuropathy ([place])   . Diabetic retinopathy [facility])     Past Surgical History:  Procedure Laterality Date  . CATARACT EXTRACTION W/PHACO Right 01/19/2015   Procedure: CATARACT EXTRACTION PHACO AND INTRAOCULAR LENS PLACEMENT; CDE:  6.69;  Surgeon: Rutherford Guys, MD;  Location: [facility];  Service: Ophthalmology;  Laterality: Right;  . WRIST SURGERY Right 1996    Current Outpatient Medications  Medication Sig Dispense Refill  . glipiZIDE (GLUCOTROL XL) 2.5 MG 24 hr tablet Take 2.5 mg by mouth daily with breakfast.    . lisinopril (ZESTRIL) 20 MG tablet Take 20 mg by mouth 2 (two) times daily.    . metFORMIN (GLUCOPHAGE) 1000 MG tablet Take 1,000 mg by mouth 2 (two) times daily with a meal.    . Multiple Vitamins-Minerals (MULTIVITAMINS THER. W/MINERALS) TABS tablet Take 1 tablet by mouth daily.    . simvastatin (ZOCOR) 10 MG tablet Take 10 mg by mouth daily.     No current facility-administered medications for this visit.    Allergies as of 07/25/2019  . (No Known Allergies)    Family History  Problem Relation Age of Onset  . Cancer Sister        uterus  . Dementia Mother     Social History   Socioeconomic History  . Marital status: Widowed    Spouse name: Not on file  . Number of children: Not on file  . Years of education: Not on file  .  Highest education level: Not on file  Occupational History  . Not on file  Tobacco Use  . Smoking status: Current Every Day Smoker    Types: Cigars  . Smokeless tobacco: Never Used  . Tobacco comment: i cigar 2-3/week  Substance and Sexual Activity  . Alcohol use: Yes    Alcohol/week: 14.0 standard drinks    Types: 14 Standard drinks or equivalent per week    Comment: daily  . Drug use: No  . Sexual activity: Not on file  Other Topics Concern  . Not on file  Social History Narrative  . Not on file   Social Determinants of Health   Financial Resource Strain:   . Difficulty of Paying Living Expenses: Not on file  Food Insecurity:   . Worried About Charity fundraiser in the Last Year: Not on file  . Ran Out of Food in the Last Year: Not on file  Transportation Needs:   . Lack of Transportation (Medical): Not on file  . Lack of Transportation (Non-Medical): Not on file  Physical Activity:   . Days of Exercise per Week: Not on file  . Minutes of Exercise per Session: Not on file  Stress:   . Feeling of Stress : Not on file  Social Connections:   .  Frequency of Communication with Friends and Family: Not on file  . Frequency of Social Gatherings with Friends and Family: Not on file  . Attends Religious Services: Not on file  . Active Member of Clubs or Organizations: Not on file  . Attends Banker Meetings: Not on file  . Marital Status: Not on file  Intimate Partner Violence:   . Fear of Current or Ex-Partner: Not on file  . Emotionally Abused: Not on file  . Physically Abused: Not on file  . Sexually Abused: Not on file    Review of Systems: Gen: Denies any fever, chills, fatigue, weight loss, lack of appetite.  CV: Denies chest pain, heart palpitations, peripheral edema, syncope.  Resp: Denies shortness of breath at rest or with exertion. Denies wheezing or cough.  GI: see HPI GU : Denies urinary burning, urinary frequency, urinary hesitancy MS: Denies  joint pain, muscle weakness, cramps, or limitation of movement.  Derm: Denies rash, itching, dry skin Psych: Denies depression, anxiety, memory loss, and confusion Heme: Denies bruising, bleeding, and enlarged lymph nodes.  Physical Exam: BP (!) 170/91   Pulse 75   Temp (!) 97 F (36.1 C) (Oral)   Ht 5\' 7"  (1.702 m)   Wt 224 lb 12.8 oz (102 kg)   BMI 35.21 kg/m  General:   Alert and oriented. Pleasant and cooperative. Well-nourished and well-developed.  Head:  Normocephalic and atraumatic. Eyes:  Without icterus, sclera clear and conjunctiva pink.  Ears:  Normal auditory acuity. Lungs:  Faint inspiratory wheeze posterior lung bases Heart:  S1, S2 present without murmurs appreciated.  Abdomen:  +BS, soft, non-tender and non-distended. No HSM noted. No guarding or rebound. No masses appreciated.  Rectal:  Deferred  Msk:  Symmetrical without gross deformities. Normal posture. Extremities:  Without edema. Neurologic:  Alert and  oriented x4 Psych:  Alert and cooperative. Normal mood and affect.  ASSESSMENT: Jeffrey Sosa is a 69 y.o. male presenting today with heme positive stool, no prior colonoscopy, no family history of colorectal cancer or polyps, without any concerning lower or upper GI signs/symptoms. No overt GI bleeding reported. Needs diagnostic colonoscopy in near future.    PLAN:  Proceed with TCS with Dr. 78 in near future using Propofol: the risks, benefits, and alternatives have been discussed with the patient in detail. The patient states understanding and desires to proceed.  Do not take diabetes medication the day of the procedure  Jena Gauss, PhD, ANP-BC [facility]

## 2019-07-25 NOTE — Telephone Encounter (Signed)
Pt called office, TCS w/Propofol w/RMR scheduled for 10/09/19 at 10:30am. Rx for prep sent to pharmacy. Orders entered.

## 2019-07-25 NOTE — Patient Instructions (Addendum)
We are arranging a colonoscopy with Dr. Jena Gauss in the near future.  Do not take glipizide or metformin the morning of the procedure. You should take the blood pressure medication with small sips of water.  Further recommendations to follow!  It was a pleasure to see you today. I want to create trusting relationships with patients to provide genuine, compassionate, and quality care. I value your feedback. If you receive a survey regarding your visit,  I greatly appreciate you taking time to fill this out.   Gelene Mink, PhD, ANP-BC [facility]

## 2019-07-25 NOTE — Telephone Encounter (Signed)
Pre-op and COVID test 10/07/19. Letter mailed with procedure instructions. 

## 2019-08-06 DIAGNOSIS — E1165 Type 2 diabetes mellitus with hyperglycemia: Secondary | ICD-10-CM | POA: Diagnosis not present

## 2019-08-06 DIAGNOSIS — I10 Essential (primary) hypertension: Secondary | ICD-10-CM | POA: Diagnosis not present

## 2019-08-21 DIAGNOSIS — H4061X4 Glaucoma secondary to drugs, right eye, indeterminate stage: Secondary | ICD-10-CM | POA: Diagnosis not present

## 2019-08-21 DIAGNOSIS — Z961 Presence of intraocular lens: Secondary | ICD-10-CM | POA: Diagnosis not present

## 2019-08-21 DIAGNOSIS — H25812 Combined forms of age-related cataract, left eye: Secondary | ICD-10-CM | POA: Diagnosis not present

## 2019-08-21 DIAGNOSIS — E113313 Type 2 diabetes mellitus with moderate nonproliferative diabetic retinopathy with macular edema, bilateral: Secondary | ICD-10-CM | POA: Diagnosis not present

## 2019-08-28 DIAGNOSIS — Z0001 Encounter for general adult medical examination with abnormal findings: Secondary | ICD-10-CM | POA: Diagnosis not present

## 2019-08-28 DIAGNOSIS — E785 Hyperlipidemia, unspecified: Secondary | ICD-10-CM | POA: Diagnosis not present

## 2019-08-28 DIAGNOSIS — E113591 Type 2 diabetes mellitus with proliferative diabetic retinopathy without macular edema, right eye: Secondary | ICD-10-CM | POA: Diagnosis not present

## 2019-08-28 DIAGNOSIS — E113599 Type 2 diabetes mellitus with proliferative diabetic retinopathy without macular edema, unspecified eye: Secondary | ICD-10-CM | POA: Diagnosis not present

## 2019-08-28 DIAGNOSIS — I10 Essential (primary) hypertension: Secondary | ICD-10-CM | POA: Diagnosis not present

## 2019-08-28 DIAGNOSIS — Z6834 Body mass index (BMI) 34.0-34.9, adult: Secondary | ICD-10-CM | POA: Diagnosis not present

## 2019-08-28 DIAGNOSIS — E1142 Type 2 diabetes mellitus with diabetic polyneuropathy: Secondary | ICD-10-CM | POA: Diagnosis not present

## 2019-09-28 DIAGNOSIS — E785 Hyperlipidemia, unspecified: Secondary | ICD-10-CM | POA: Diagnosis not present

## 2019-09-28 DIAGNOSIS — E1165 Type 2 diabetes mellitus with hyperglycemia: Secondary | ICD-10-CM | POA: Diagnosis not present

## 2019-10-06 ENCOUNTER — Encounter (HOSPITAL_COMMUNITY): Payer: Self-pay

## 2019-10-06 ENCOUNTER — Other Ambulatory Visit: Payer: Self-pay

## 2019-10-07 ENCOUNTER — Telehealth: Payer: Self-pay | Admitting: Internal Medicine

## 2019-10-07 ENCOUNTER — Other Ambulatory Visit (HOSPITAL_COMMUNITY): Payer: Medicare Other

## 2019-10-07 ENCOUNTER — Encounter (HOSPITAL_COMMUNITY)
Admission: RE | Admit: 2019-10-07 | Discharge: 2019-10-07 | Disposition: A | Discharge status: Home / Self Care | Payer: Medicare Other | Source: Ambulatory Visit | Attending: Internal Medicine | Admitting: Internal Medicine

## 2019-10-07 ENCOUNTER — Other Ambulatory Visit (HOSPITAL_COMMUNITY)
Admission: RE | Admit: 2019-10-07 | Discharge: 2019-10-07 | Disposition: A | Discharge status: Home / Self Care | Payer: Medicare Other | Source: Ambulatory Visit | Attending: Internal Medicine | Admitting: Internal Medicine

## 2019-10-07 DIAGNOSIS — Z01812 Encounter for preprocedural laboratory examination: Secondary | ICD-10-CM | POA: Insufficient documentation

## 2019-10-07 DIAGNOSIS — Z20822 Contact with and (suspected) exposure to covid-19: Secondary | ICD-10-CM | POA: Diagnosis not present

## 2019-10-07 NOTE — Telephone Encounter (Signed)
Spoke to pt, he came by office and picked up Miralax instructions.

## 2019-10-07 NOTE — Telephone Encounter (Signed)
Pt called to verify his pre op and covid times. Also, his prep at [facility] is on back order. I told him per MB to come by office after his covid test at 230 to pick up new instructions for miralax.

## 2019-10-08 LAB — SARS CORONAVIRUS 2 (TAT 6-24 HRS): SARS Coronavirus 2: NEGATIVE

## 2019-10-09 ENCOUNTER — Ambulatory Visit (HOSPITAL_COMMUNITY): Payer: Medicare Other | Admitting: Anesthesiology

## 2019-10-09 ENCOUNTER — Encounter (HOSPITAL_COMMUNITY)
Admission: RE | Disposition: A | Discharge status: Home / Self Care | Payer: Self-pay | Source: Home / Self Care | Attending: Internal Medicine

## 2019-10-09 ENCOUNTER — Ambulatory Visit (HOSPITAL_COMMUNITY)
Admission: RE | Admit: 2019-10-09 | Discharge: 2019-10-09 | Disposition: A | Discharge status: Home / Self Care | Payer: Medicare Other | Attending: Internal Medicine | Admitting: Internal Medicine

## 2019-10-09 ENCOUNTER — Other Ambulatory Visit: Payer: Self-pay

## 2019-10-09 ENCOUNTER — Encounter (HOSPITAL_COMMUNITY): Payer: Self-pay | Admitting: Internal Medicine

## 2019-10-09 DIAGNOSIS — R195 Other fecal abnormalities: Secondary | ICD-10-CM | POA: Diagnosis not present

## 2019-10-09 DIAGNOSIS — I10 Essential (primary) hypertension: Secondary | ICD-10-CM | POA: Insufficient documentation

## 2019-10-09 DIAGNOSIS — E11319 Type 2 diabetes mellitus with unspecified diabetic retinopathy without macular edema: Secondary | ICD-10-CM | POA: Insufficient documentation

## 2019-10-09 DIAGNOSIS — F17290 Nicotine dependence, other tobacco product, uncomplicated: Secondary | ICD-10-CM | POA: Insufficient documentation

## 2019-10-09 DIAGNOSIS — Z7984 Long term (current) use of oral hypoglycemic drugs: Secondary | ICD-10-CM | POA: Diagnosis not present

## 2019-10-09 DIAGNOSIS — Z79899 Other long term (current) drug therapy: Secondary | ICD-10-CM | POA: Diagnosis not present

## 2019-10-09 DIAGNOSIS — E7800 Pure hypercholesterolemia, unspecified: Secondary | ICD-10-CM | POA: Diagnosis not present

## 2019-10-09 DIAGNOSIS — E1140 Type 2 diabetes mellitus with diabetic neuropathy, unspecified: Secondary | ICD-10-CM | POA: Diagnosis not present

## 2019-10-09 DIAGNOSIS — E119 Type 2 diabetes mellitus without complications: Secondary | ICD-10-CM | POA: Diagnosis not present

## 2019-10-09 HISTORY — PX: COLONOSCOPY WITH PROPOFOL: SHX5780

## 2019-10-09 LAB — CBC
HCT: 45.7 % (ref 39.0–52.0)
Hemoglobin: 14.7 g/dL (ref 13.0–17.0)
MCH: 30.2 pg (ref 26.0–34.0)
MCHC: 32.2 g/dL (ref 30.0–36.0)
MCV: 93.8 fL (ref 80.0–100.0)
Platelets: 224 10*3/uL (ref 150–400)
RBC: 4.87 MIL/uL (ref 4.22–5.81)
RDW: 12.9 % (ref 11.5–15.5)
WBC: 8 10*3/uL (ref 4.0–10.5)
nRBC: 0 % (ref 0.0–0.2)

## 2019-10-09 LAB — GLUCOSE, CAPILLARY
Glucose-Capillary: 135 mg/dL — ABNORMAL HIGH (ref 70–99)
Glucose-Capillary: 122 mg/dL — ABNORMAL HIGH (ref 70–99)

## 2019-10-09 SURGERY — COLONOSCOPY WITH PROPOFOL
Anesthesia: General

## 2019-10-09 MED ORDER — CHLORHEXIDINE GLUCONATE CLOTH 2 % EX PADS: 6.0000 | MEDICATED_PAD | Freq: Once | CUTANEOUS | Status: DC

## 2019-10-09 MED ORDER — PROPOFOL 10 MG/ML IV BOLUS
INTRAVENOUS | Status: AC
  Filled 2019-10-09: qty 40

## 2019-10-09 MED ORDER — LACTATED RINGERS IV SOLN
Freq: Once | INTRAVENOUS | Status: AC
  Administered 2019-10-09: 10:00:00 1000 mL via INTRAVENOUS

## 2019-10-09 MED ORDER — LACTATED RINGERS IV SOLN: INTRAVENOUS | Status: DC | PRN

## 2019-10-09 MED ORDER — PROPOFOL 10 MG/ML IV BOLUS
INTRAVENOUS | Status: DC | PRN
Start: 2019-10-09 — End: 2019-10-09
  Administered 2019-10-09: 70 mg via INTRAVENOUS
  Administered 2019-10-09: 30 mg via INTRAVENOUS

## 2019-10-09 MED ORDER — PROPOFOL 500 MG/50ML IV EMUL
INTRAVENOUS | Status: DC | PRN
Start: 2019-10-09 — End: 2019-10-09
  Administered 2019-10-09: 200 ug/kg/min via INTRAVENOUS

## 2019-10-09 NOTE — Discharge Instructions (Signed)
Colonoscopy Discharge Instructions  Read the instructions outlined below and refer to this sheet in the next few weeks. These discharge instructions provide you with general information on caring for yourself after you leave the hospital. Your doctor may also give you specific instructions. While your treatment has been planned according to the most current medical practices available, unavoidable complications occasionally occur. If you have any problems or questions after discharge, call Dr. Gala Romney at [phone]. ACTIVITY  You may resume your regular activity, but move at a slower pace for the next 24 hours.   Take frequent rest periods for the next 24 hours.   Walking will help get rid of the air and reduce the bloated feeling in your belly (abdomen).   No driving for 24 hours (because of the medicine (anesthesia) used during the test).    Do not sign any important legal documents or operate any machinery for 24 hours (because of the anesthesia used during the test).  NUTRITION  Drink plenty of fluids.   You may resume your normal diet as instructed by your doctor.   Begin with a light meal and progress to your normal diet. Heavy or fried foods are harder to digest and may make you feel sick to your stomach (nauseated).   Avoid alcoholic beverages for 24 hours or as instructed.  MEDICATIONS  You may resume your normal medications unless your doctor tells you otherwise.  WHAT YOU CAN EXPECT TODAY  Some feelings of bloating in the abdomen.   Passage of more gas than usual.   Spotting of blood in your stool or on the toilet paper.  IF YOU HAD POLYPS REMOVED DURING THE COLONOSCOPY:  No aspirin products for 7 days or as instructed.   No alcohol for 7 days or as instructed.   Eat a soft diet for the next 24 hours.  FINDING OUT THE RESULTS OF YOUR TEST Not all test results are available during your visit. If your test results are not back during the visit, make an appointment  with your caregiver to find out the results. Do not assume everything is normal if you have not heard from your caregiver or the medical facility. It is important for you to follow up on all of your test results.  SEEK IMMEDIATE MEDICAL ATTENTION IF:  You have more than a spotting of blood in your stool.   Your belly is swollen (abdominal distention).   You are nauseated or vomiting.   You have a temperature over 101.   You have abdominal pain or discomfort that is severe or gets worse throughout the day.    Monitored Anesthesia Care, Care After These instructions provide you with information about caring for yourself after your procedure. Your health care provider may also give you more specific instructions. Your treatment has been planned according to current medical practices, but problems sometimes occur. Call your health care provider if you have any problems or questions after your procedure. What can I expect after the procedure? After your procedure, you may: Feel sleepy for several hours. Feel clumsy and have poor balance for several hours. Feel forgetful about what happened after the procedure. Have poor judgment for several hours. Feel nauseous or vomit. Have a sore throat if you had a breathing tube during the procedure. Follow these instructions at home: For at least 24 hours after the procedure:     Have a responsible adult stay with you. It is important to have someone help care for you until  you are awake and alert. Rest as needed. Do not: Participate in activities in which you could fall or become injured. Drive. Use heavy machinery. Drink alcohol. Take sleeping pills or medicines that cause drowsiness. Make important decisions or sign legal documents. Take care of children on your own. Eating and drinking Follow the diet that is recommended by your health care provider. If you vomit, drink water, juice, or soup when you can drink without vomiting. Make  sure you have little or no nausea before eating solid foods. General instructions Take over-the-counter and prescription medicines only as told by your health care provider. If you have sleep apnea, surgery and certain medicines can increase your risk for breathing problems. Follow instructions from your health care provider about wearing your sleep device: Anytime you are sleeping, including during daytime naps. While taking prescription pain medicines, sleeping medicines, or medicines that make you drowsy. If you smoke, do not smoke without supervision. Keep all follow-up visits as told by your health care provider. This is important. Contact a health care provider if: You keep feeling nauseous or you keep vomiting. You feel light-headed. You develop a rash. You have a fever. Get help right away if: You have trouble breathing. Summary For several hours after your procedure, you may feel sleepy and have poor judgment. Have a responsible adult stay with you for at least 24 hours or until you are awake and alert. This information is not intended to replace advice given to you by your health care provider. Make sure you discuss any questions you have with your health care provider. Document Revised: 08/27/2017 Document Reviewed: 09/19/2015 Elsevier Patient Education  [employer].      Your colonoscopy was normal today  I do not recommend a future colonoscopy unless new symptoms develop  CBC today  At patient request, I called Clint at [phone] and reviewed results

## 2019-10-09 NOTE — Op Note (Signed)
[facility] Patient Name: Jeffrey Sosa Procedure Date: 10/09/2019 9:40 AM MRN: [id number] Date of Birth: 1950/12/09 Attending MD: Norvel Richards , MD CSN: [id number] Age: 69 Admit Type: Outpatient Procedure:                Colonoscopy Indications:              Heme positive stool Providers:                Norvel Richards, MD, Janeece Riggers, RN, Nelma Rothman, Technician Referring MD:              Medicines:                Propofol per Anesthesia Complications:            No immediate complications. Estimated Blood Loss:     Estimated blood loss: none. Procedure:                Pre-Anesthesia Assessment:                           - Prior to the procedure, a History and Physical                            was performed, and patient medications and                            allergies were reviewed. The patient's tolerance of                            previous anesthesia was also reviewed. The risks                            and benefits of the procedure and the sedation                            options and risks were discussed with the patient.                            All questions were answered, and informed consent                            was obtained. Prior Anticoagulants: The patient has                            taken no previous anticoagulant or antiplatelet                            agents. ASA Grade Assessment: II - A patient with                            mild systemic disease. After reviewing the risks  and benefits, the patient was deemed in                            satisfactory condition to undergo the procedure.                           After obtaining informed consent, the colonoscope                            was passed under direct vision. Throughout the                            procedure, the patient's blood pressure, pulse, and                            oxygen saturations were  monitored continuously. The                            CF-[id number] ([id number]) scope was introduced through                            the anus and advanced to the the cecum, identified                            by appendiceal orifice and ileocecal valve. The                            colonoscopy was performed without difficulty. The                            patient tolerated the procedure well. The quality                            of the bowel preparation was adequate. Scope In: 10:03:14 AM Scope Out: 10:20:26 AM Scope Withdrawal Time: 0 hours 14 minutes 45 seconds  Total Procedure Duration: 0 hours 17 minutes 12 seconds  Findings:      The perianal and digital rectal examinations were normal.      The colon (entire examined portion) appeared normal.      The retroflexed view of the distal rectum and anal verge was normal and       showed no anal or rectal abnormalities. Impression:               - The entire examined colon is normal.                           - The distal rectum and anal verge are normal on                            retroflexion view.                           - No specimens collected. Moderate Sedation:      Moderate (conscious) sedation was personally administered by an       anesthesia  professional. The following parameters were monitored: oxygen       saturation, heart rate, blood pressure, respiratory rate, EKG, adequacy       of pulmonary ventilation, and response to care. Recommendation:           - Patient has a contact number available for                            emergencies. The signs and symptoms of potential                            delayed complications were discussed with the                            patient. Return to normal activities tomorrow.                            Written discharge instructions were provided to the                            patient.                           - Resume previous diet.                           -  Continue present medications. CBC today                           - No repeat colonoscopy due to age.                           - Return to GI clinic (date not yet determined). Procedure Code(s):        --- Professional ---                           [phone], Colonoscopy, flexible; diagnostic, including                            collection of specimen(s) by brushing or washing,                            when performed (separate procedure) Diagnosis Code(s):        --- Professional ---                           R19.5, Other fecal abnormalities CPT copyright 2019 American Medical Association. All rights reserved. The codes documented in this report are preliminary and upon coder review may  be revised to meet current compliance requirements. Gerrit Friends. Aleysha Meckler, MD Gennette Pac, MD 10/09/2019 10:27:17 AM This report has been signed electronically. Number of Addenda: 0

## 2019-10-09 NOTE — Anesthesia Postprocedure Evaluation (Signed)
Anesthesia Post Note  Patient: Jeffrey Sosa  Procedure(s) Performed: COLONOSCOPY WITH PROPOFOL (N/A )  Patient location during evaluation: PACU Anesthesia Type: General Level of consciousness: oriented, patient cooperative and awake and alert Pain management: pain level controlled Vital Signs Assessment: post-procedure vital signs reviewed and stable Respiratory status: spontaneous breathing Cardiovascular status: blood pressure returned to baseline Anesthetic complications: no     Last Vitals:  Vitals:   10/09/19 0926  BP: 129/80  Pulse: 93  Resp: 20  Temp: 36.6 C  SpO2: 97%    Last Pain:  Vitals:   10/09/19 0930  TempSrc:   PainSc: 0-No pain                 Chelsea Aus

## 2019-10-09 NOTE — Transfer of Care (Signed)
Immediate Anesthesia Transfer of Care Note  Patient: Jeffrey Sosa  Procedure(s) Performed: COLONOSCOPY WITH PROPOFOL (N/A )  Patient Location: PACU  Anesthesia Type:General  Level of Consciousness: drowsy and patient cooperative  Airway & Oxygen Therapy: Patient Spontanous Breathing and Patient connected to nasal cannula oxygen  Post-op Assessment: Report given to RN and Post -op Vital signs reviewed and stable  Post vital signs: Reviewed and stable  Last Vitals:  Vitals Value Taken Time  BP    Temp    Pulse 102 10/09/19 1027  Resp 26 10/09/19 1027  SpO2 97 % 10/09/19 1027  Vitals shown include unvalidated device data.  Last Pain:  Vitals:   10/09/19 0930  TempSrc:   PainSc: 0-No pain         Complications: No apparent anesthesia complications

## 2019-10-09 NOTE — Anesthesia Preprocedure Evaluation (Signed)
Anesthesia Evaluation  Patient identified by MRN, date of birth, ID band Patient awake    Reviewed: Allergy & Precautions, NPO status , Patient's Chart, lab work & pertinent test results  Airway Mallampati: II  TM Distance: >3 FB Neck ROM: Full    Dental  (+) Upper Dentures, Partial Upper, Missing, Dental Advisory Given   Pulmonary shortness of breath and with exertion, pneumonia, Current Smoker and Patient abstained from smoking.,    breath sounds clear to auscultation       Cardiovascular Exercise Tolerance: Good hypertension, Pt. on medications Normal cardiovascular exam Rhythm:Regular Rate:Normal     Neuro/Psych negative neurological ROS  negative psych ROS   GI/Hepatic negative GI ROS, Neg liver ROS,   Endo/Other  diabetes, Well Controlled, Type 2, Oral Hypoglycemic Agents  Renal/GU negative Renal ROS  negative genitourinary   Musculoskeletal negative musculoskeletal ROS (+)   Abdominal   Peds negative pediatric ROS (+)  Hematology negative hematology ROS (+)   Anesthesia Other Findings 14-Jul-2018 17:39:53 [facility] ROUTINE RECORD Sinus rhythm No STEMI. Confirmed by Alona Bene [phone]) on 07/14/2018 5:42:05 PM  Reproductive/Obstetrics negative OB ROS                             Anesthesia Physical Anesthesia Plan  ASA: III  Anesthesia Plan: General   Post-op Pain Management:    Induction: Intravenous  PONV Risk Score and Plan:   Airway Management Planned: Nasal Cannula, Natural Airway and Simple Face Mask  Additional Equipment:   Intra-op Plan:   Post-operative Plan:   Informed Consent: I have reviewed the patients History and Physical, chart, labs and discussed the procedure including the risks, benefits and alternatives for the proposed anesthesia with the patient or authorized representative who has indicated his/her understanding and  acceptance.     Dental advisory given  Plan Discussed with: CRNA and Surgeon  Anesthesia Plan Comments:         Anesthesia Quick Evaluation

## 2019-10-09 NOTE — H&P (Signed)
@LOGO @   Primary Care Physician:  , MD Primary Gastroenterologist:  Dr. Avon Gully  Pre-Procedure History & Physical: HPI:  Jeffrey Sosa is a 69 y.o. male here for diagnostic colonoscopy or Hemoccult positive stool.  Denies melena or rectal bleeding.  No prior colonoscopy.  Past Medical History:  Diagnosis Date  . Diabetes mellitus without complication (HCC)   . Diabetic neuropathy (HCC)   . Diabetic retinopathy (HCC)   . Glaucoma   . HTN (hypertension)   . Hypercholesterolemia     Past Surgical History:  Procedure Laterality Date  . CATARACT EXTRACTION W/PHACO Right 01/19/2015   Procedure: CATARACT EXTRACTION PHACO AND INTRAOCULAR LENS PLACEMENT; CDE:  6.69;  Surgeon: 03/21/2015, MD;  Location: [facility];  Service: Ophthalmology;  Laterality: Right;  . right eye shunt  12/2018  . WRIST SURGERY Right 1996    Prior to Admission medications   Medication Sig Start Date End Date Taking? Authorizing Provider  Ascorbic Acid (VITAMIN C PO) Take 1 tablet by mouth daily.   Yes [provider]  glipiZIDE (GLUCOTROL XL) 2.5 MG 24 hr tablet Take 2.5 mg by mouth daily with breakfast.   Yes [provider]  lisinopril (ZESTRIL) 20 MG tablet Take 20 mg by mouth 2 (two) times daily.   Yes [provider]  metFORMIN (GLUCOPHAGE) 1000 MG tablet Take 1,000 mg by mouth 2 (two) times daily with a meal.   Yes [provider]  Multiple Vitamins-Minerals (MULTIVITAMINS THER. W/MINERALS) TABS tablet Take 1 tablet by mouth daily.   Yes [provider]  simvastatin (ZOCOR) 10 MG tablet Take 10 mg by mouth daily.   Yes [provider]  polyethylene glycol-electrolytes (TRILYTE) 420 g solution Take 4,000 mLs by mouth as directed. 07/25/19   09/22/19, MD    Allergies as of 07/25/2019  . (No Known Allergies)    Family History  Problem Relation Age of Onset  . Cancer Sister        uterus  . Dementia Mother   . Colon cancer Neg Hx   .  Colon polyps Neg Hx     Social History   Socioeconomic History  . Marital status: Widowed    Spouse name: Not on file  . Number of children: Not on file  . Years of education: Not on file  . Highest education level: Not on file  Occupational History  . Not on file  Tobacco Use  . Smoking status: Current Every Day Smoker    Types: Cigars  . Smokeless tobacco: Never Used  . Tobacco comment: i cigar 2-3/week  Substance and Sexual Activity  . Alcohol use: Yes    Comment:  history of heavy alcohol use, now 1-2 beers several times a week   . Drug use: No  . Sexual activity: Not on file  Other Topics Concern  . Not on file  Social History Narrative  . Not on file   Social Determinants of Health   Financial Resource Strain:   . Difficulty of Paying Living Expenses:   Food Insecurity:   . Worried About 09/22/2019 in the Last Year:   . Programme researcher, broadcasting/film/video in the Last Year:   Transportation Needs:   . Barista (Medical):   Freight forwarder Lack of Transportation (Non-Medical):   Physical Activity:   . Days of Exercise per Week:   . Minutes of Exercise per Session:   Stress:   . Feeling of Stress :  Social Connections:   . Frequency of Communication with Friends and Family:   . Frequency of Social Gatherings with Friends and Family:   . Attends Religious Services:   . Active Member of Clubs or Organizations:   . Attends Archivist Meetings:   Marland Kitchen Marital Status:   Intimate Partner Violence:   . Fear of Current or Ex-Partner:   . Emotionally Abused:   Marland Kitchen Physically Abused:   . Sexually Abused:     Review of Systems: See HPI, otherwise negative ROS  Physical Exam: There were no vitals taken for this visit. General:   Alert,  Well-developed, well-nourished, pleasant and cooperative in NAD Mouth:  No deformity or lesions. Neck:  Supple; no masses or thyromegaly. No significant cervical adenopathy. Lungs:  Clear throughout to auscultation.   No wheezes,  crackles, or rhonchi. No acute distress. Heart:  Regular rate and rhythm; no murmurs, clicks, rubs,  or gallops. Abdomen: Non-distended, normal bowel sounds.  Soft and nontender without appreciable mass or hepatosplenomegaly.  Pulses:  Normal pulses noted. Extremities:  Without clubbing or edema.  Impression/Plan: 69 year old gentleman with Hemoccult positive stool he is devoid of any other GI symptoms.  Here for his first ever colonoscopy for diagnostic purposes. The risks, benefits, limitations, alternatives and imponderables have been reviewed with the patient. Questions have been answered. All parties are agreeable.      Notice: This dictation was prepared with Dragon dictation along with smaller phrase technology. Any transcriptional errors that result from this process are unintentional and may not be corrected upon review.

## 2019-10-13 DIAGNOSIS — H4061X4 Glaucoma secondary to drugs, right eye, indeterminate stage: Secondary | ICD-10-CM | POA: Diagnosis not present

## 2019-10-13 DIAGNOSIS — T380X5A Adverse effect of glucocorticoids and synthetic analogues, initial encounter: Secondary | ICD-10-CM | POA: Diagnosis not present

## 2019-10-13 NOTE — Addendum Note (Signed)
Addendum  created 10/13/19 1451 by Moshe Salisbury, CRNA   Charge Capture section accepted

## 2019-10-16 DIAGNOSIS — Z961 Presence of intraocular lens: Secondary | ICD-10-CM | POA: Diagnosis not present

## 2019-10-16 DIAGNOSIS — H25812 Combined forms of age-related cataract, left eye: Secondary | ICD-10-CM | POA: Diagnosis not present

## 2019-10-16 DIAGNOSIS — E113313 Type 2 diabetes mellitus with moderate nonproliferative diabetic retinopathy with macular edema, bilateral: Secondary | ICD-10-CM | POA: Diagnosis not present

## 2019-10-16 DIAGNOSIS — H4061X4 Glaucoma secondary to drugs, right eye, indeterminate stage: Secondary | ICD-10-CM | POA: Diagnosis not present

## 2019-10-28 DIAGNOSIS — I10 Essential (primary) hypertension: Secondary | ICD-10-CM | POA: Diagnosis not present

## 2019-10-28 DIAGNOSIS — E1165 Type 2 diabetes mellitus with hyperglycemia: Secondary | ICD-10-CM | POA: Diagnosis not present

## 2019-11-28 DIAGNOSIS — I10 Essential (primary) hypertension: Secondary | ICD-10-CM | POA: Diagnosis not present

## 2019-11-28 DIAGNOSIS — E113591 Type 2 diabetes mellitus with proliferative diabetic retinopathy without macular edema, right eye: Secondary | ICD-10-CM | POA: Diagnosis not present

## 2019-12-11 DIAGNOSIS — H4061X4 Glaucoma secondary to drugs, right eye, indeterminate stage: Secondary | ICD-10-CM | POA: Diagnosis not present

## 2019-12-11 DIAGNOSIS — T380X5A Adverse effect of glucocorticoids and synthetic analogues, initial encounter: Secondary | ICD-10-CM | POA: Diagnosis not present

## 2019-12-11 DIAGNOSIS — H25812 Combined forms of age-related cataract, left eye: Secondary | ICD-10-CM | POA: Diagnosis not present

## 2019-12-11 DIAGNOSIS — E113313 Type 2 diabetes mellitus with moderate nonproliferative diabetic retinopathy with macular edema, bilateral: Secondary | ICD-10-CM | POA: Diagnosis not present

## 2019-12-28 DIAGNOSIS — E1165 Type 2 diabetes mellitus with hyperglycemia: Secondary | ICD-10-CM | POA: Diagnosis not present

## 2019-12-28 DIAGNOSIS — I10 Essential (primary) hypertension: Secondary | ICD-10-CM | POA: Diagnosis not present

## 2020-01-28 DIAGNOSIS — I10 Essential (primary) hypertension: Secondary | ICD-10-CM | POA: Diagnosis not present

## 2020-01-28 DIAGNOSIS — E113591 Type 2 diabetes mellitus with proliferative diabetic retinopathy without macular edema, right eye: Secondary | ICD-10-CM | POA: Diagnosis not present

## 2020-02-12 DIAGNOSIS — H25812 Combined forms of age-related cataract, left eye: Secondary | ICD-10-CM | POA: Diagnosis not present

## 2020-02-12 DIAGNOSIS — T380X5A Adverse effect of glucocorticoids and synthetic analogues, initial encounter: Secondary | ICD-10-CM | POA: Diagnosis not present

## 2020-02-12 DIAGNOSIS — E113313 Type 2 diabetes mellitus with moderate nonproliferative diabetic retinopathy with macular edema, bilateral: Secondary | ICD-10-CM | POA: Diagnosis not present

## 2020-02-12 DIAGNOSIS — H4060X0 Glaucoma secondary to drugs, unspecified eye, stage unspecified: Secondary | ICD-10-CM | POA: Diagnosis not present

## 2020-02-20 DIAGNOSIS — I10 Essential (primary) hypertension: Secondary | ICD-10-CM | POA: Diagnosis not present

## 2020-02-20 DIAGNOSIS — R600 Localized edema: Secondary | ICD-10-CM | POA: Diagnosis not present

## 2020-02-20 DIAGNOSIS — Z6834 Body mass index (BMI) 34.0-34.9, adult: Secondary | ICD-10-CM | POA: Diagnosis not present

## 2020-02-20 DIAGNOSIS — E113591 Type 2 diabetes mellitus with proliferative diabetic retinopathy without macular edema, right eye: Secondary | ICD-10-CM | POA: Diagnosis not present

## 2020-04-15 DIAGNOSIS — Z961 Presence of intraocular lens: Secondary | ICD-10-CM | POA: Diagnosis not present

## 2020-04-15 DIAGNOSIS — E113313 Type 2 diabetes mellitus with moderate nonproliferative diabetic retinopathy with macular edema, bilateral: Secondary | ICD-10-CM | POA: Diagnosis not present

## 2020-04-15 DIAGNOSIS — H25812 Combined forms of age-related cataract, left eye: Secondary | ICD-10-CM | POA: Diagnosis not present

## 2020-04-15 DIAGNOSIS — H4061X4 Glaucoma secondary to drugs, right eye, indeterminate stage: Secondary | ICD-10-CM | POA: Diagnosis not present

## 2020-05-03 DIAGNOSIS — E113293 Type 2 diabetes mellitus with mild nonproliferative diabetic retinopathy without macular edema, bilateral: Secondary | ICD-10-CM | POA: Diagnosis not present

## 2020-05-03 DIAGNOSIS — H25812 Combined forms of age-related cataract, left eye: Secondary | ICD-10-CM | POA: Diagnosis not present

## 2020-05-03 DIAGNOSIS — Z961 Presence of intraocular lens: Secondary | ICD-10-CM | POA: Diagnosis not present

## 2020-05-03 DIAGNOSIS — H409 Unspecified glaucoma: Secondary | ICD-10-CM | POA: Diagnosis not present

## 2020-06-12 DEATH — deceased

## 2020-08-13 IMAGING — DX DG CHEST 2V
2 series · 2 of 2 positions shown · non-contrast
Comparison: 12/19/2012

CLINICAL DATA: Short of breath and cough today.

EXAM:
CHEST - 2 VIEW

[chest pa]
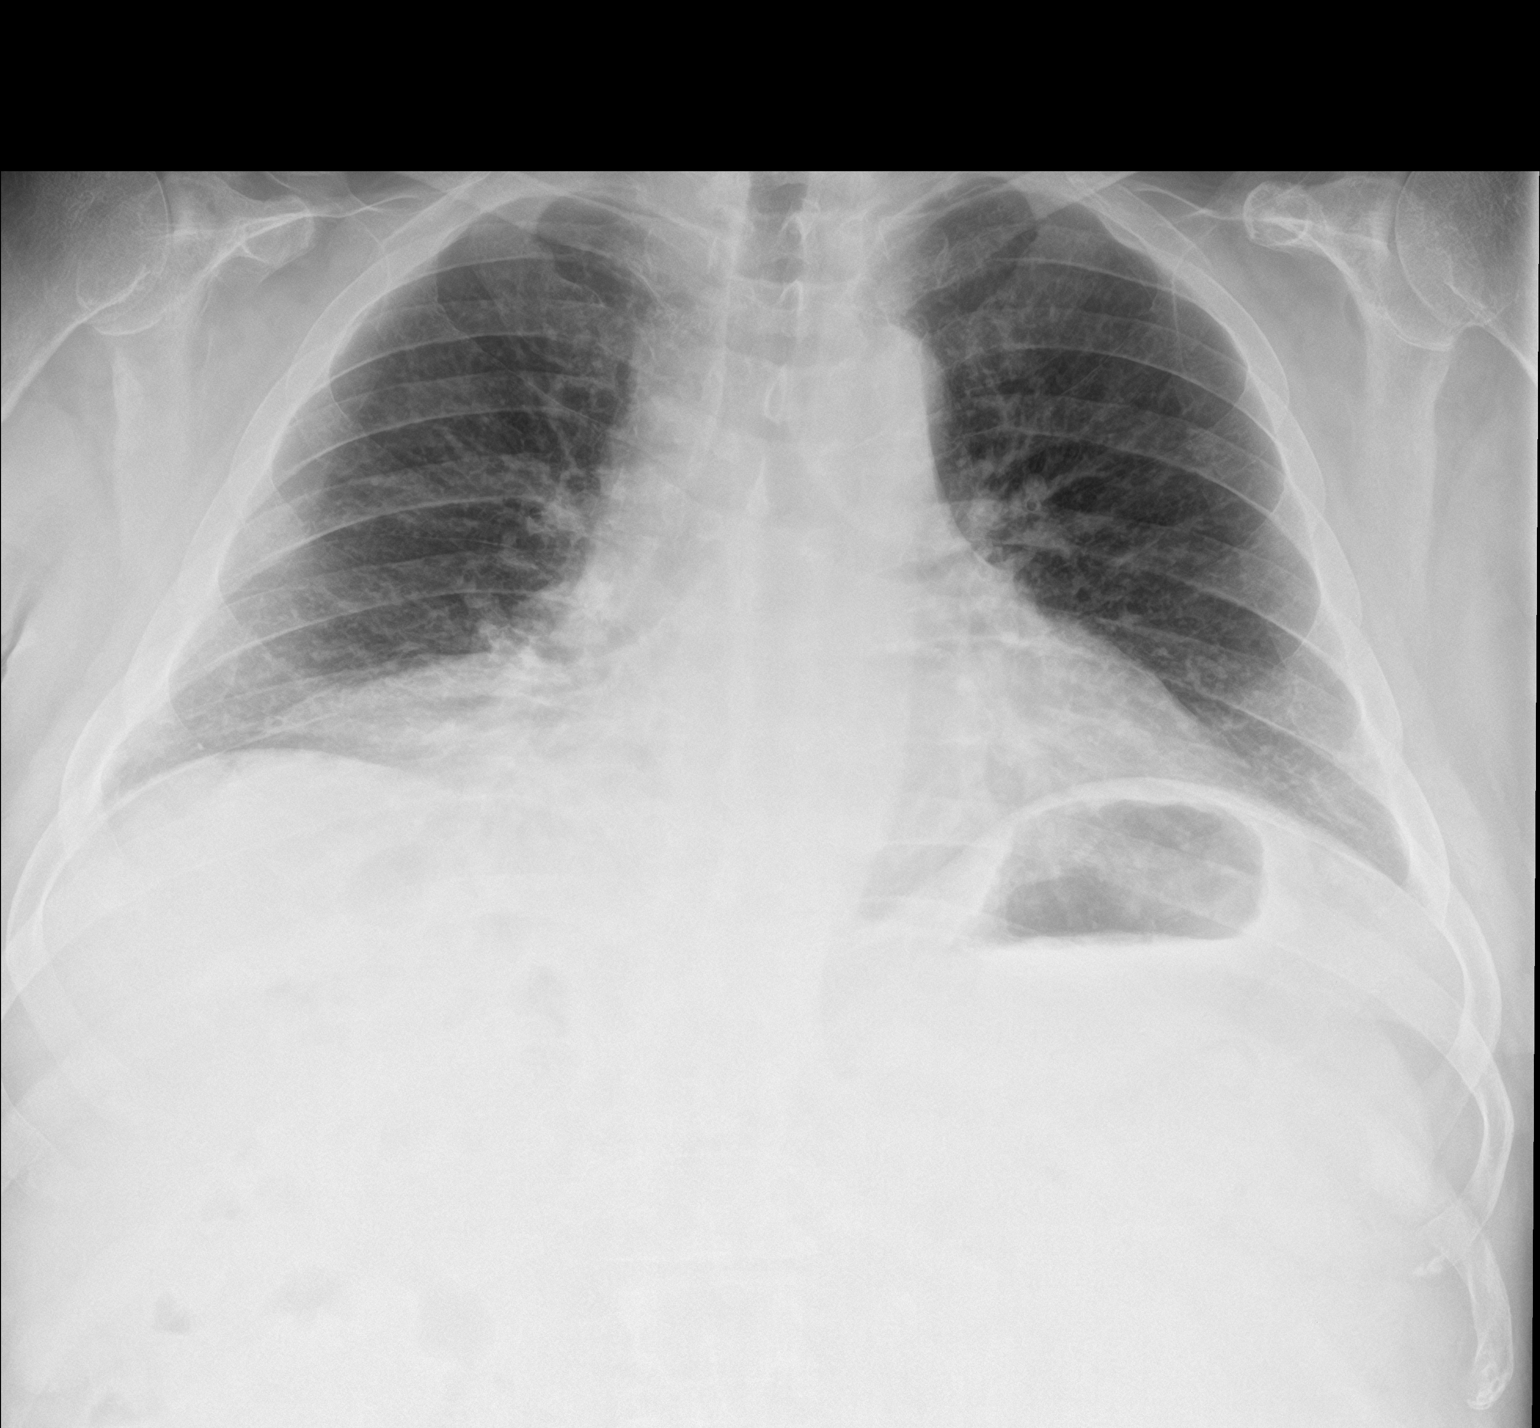

[chest lat]
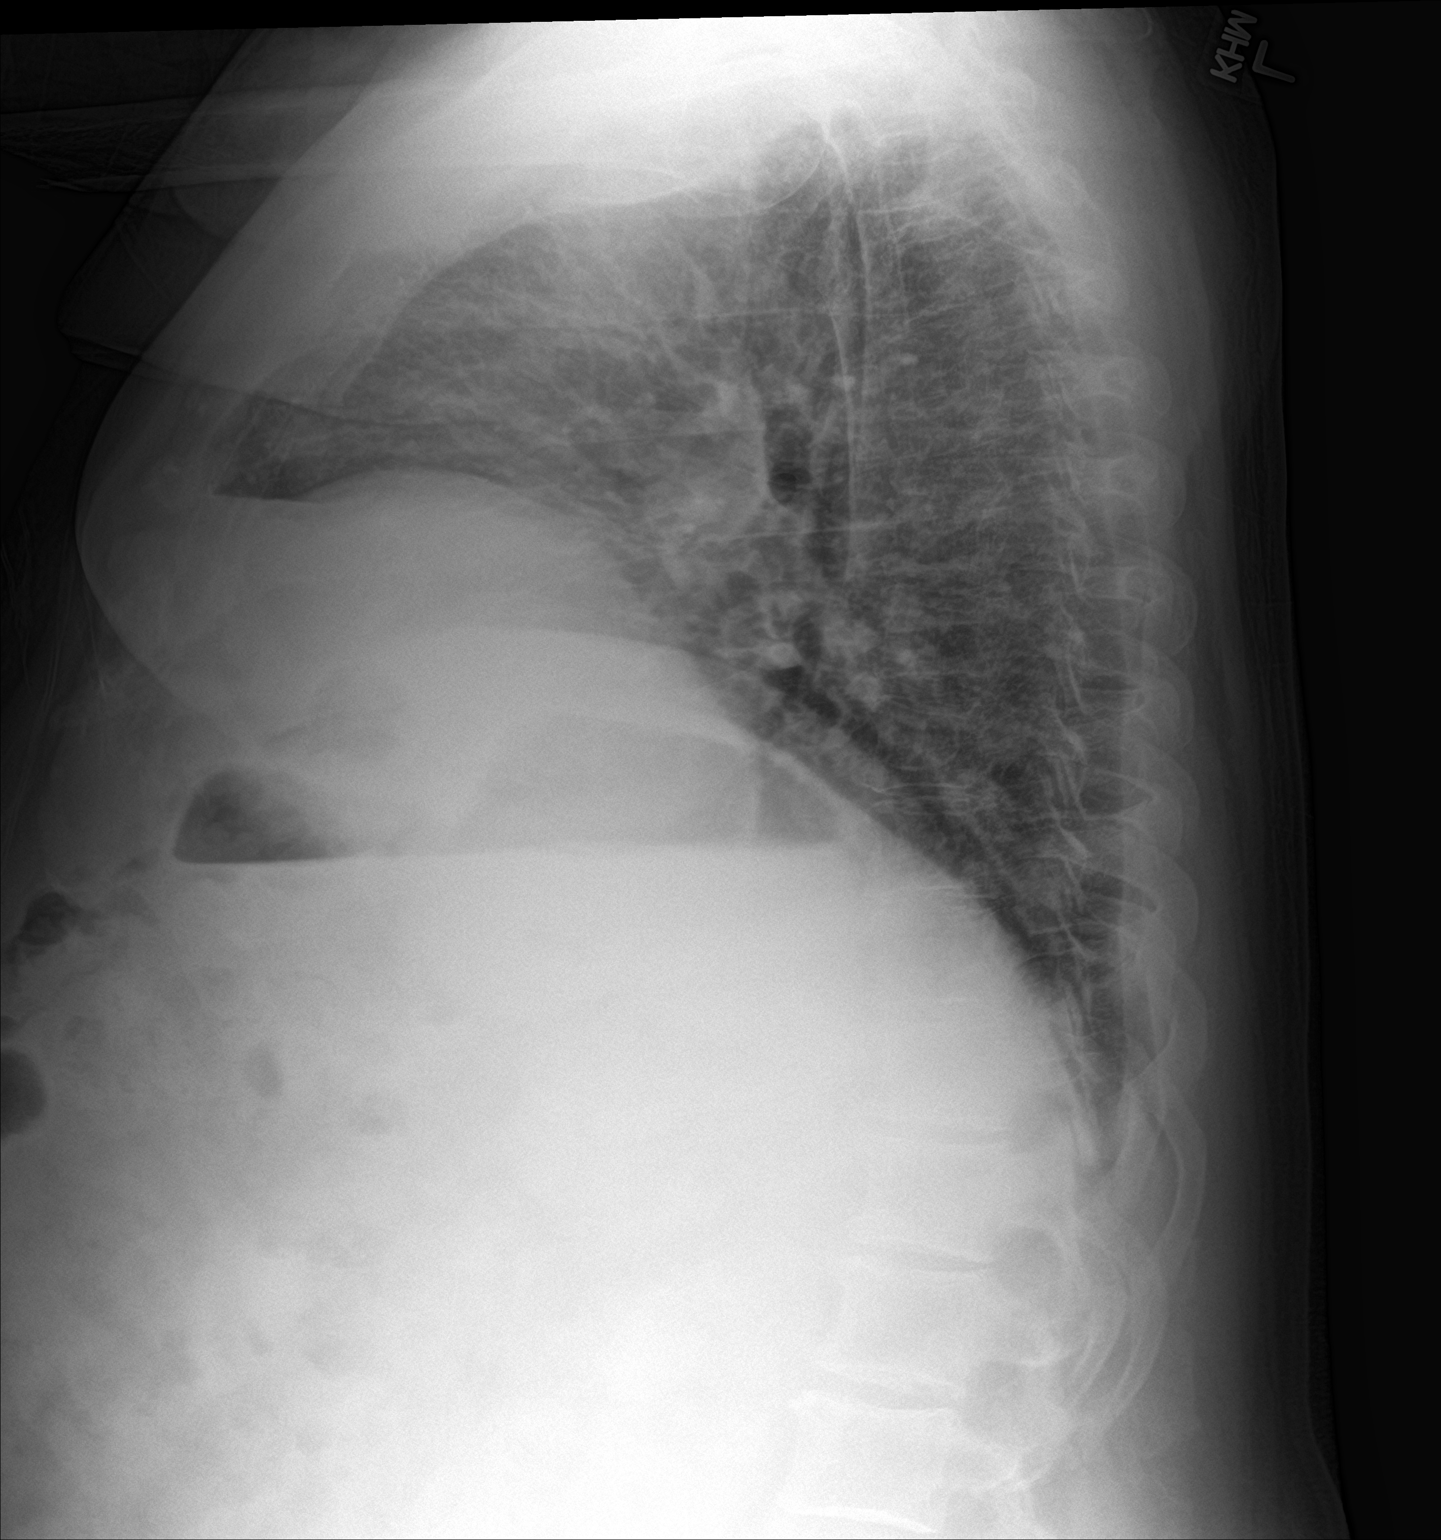

[2 of 2 positions shown; findings below may reference images not displayed]

FINDINGS: Cardiac silhouette is normal in size. No mediastinal or hilar masses
or evidence of adenopathy.

Lung volumes low with lung base bronchovascular crowding and mild
atelectasis. No evidence of pneumonia or pulmonary edema. No pleural
effusion or pneumothorax.

Skeletal structures are intact.
IMPRESSION: No active cardiopulmonary disease.
